# Patient Record
Sex: Female | Born: 1980 | Race: White | Hispanic: No | Marital: Single | Smoking: Never smoker
Health system: Southern US, Community
[De-identification: ages and names within clinical notes are randomized; demographics above are authoritative.]

## PROBLEM LIST (undated history)

## (undated) DIAGNOSIS — J45909 Unspecified asthma, uncomplicated: Secondary | ICD-10-CM

## (undated) DIAGNOSIS — L709 Acne, unspecified: Secondary | ICD-10-CM

## (undated) DIAGNOSIS — E739 Lactose intolerance, unspecified: Secondary | ICD-10-CM

## (undated) DIAGNOSIS — E785 Hyperlipidemia, unspecified: Secondary | ICD-10-CM

## (undated) DIAGNOSIS — B001 Herpesviral vesicular dermatitis: Secondary | ICD-10-CM

## (undated) DIAGNOSIS — T7840XA Allergy, unspecified, initial encounter: Secondary | ICD-10-CM

## (undated) DIAGNOSIS — F419 Anxiety disorder, unspecified: Secondary | ICD-10-CM

## (undated) DIAGNOSIS — A6000 Herpesviral infection of urogenital system, unspecified: Secondary | ICD-10-CM

## (undated) HISTORY — DX: Herpesviral vesicular dermatitis: B00.1

## (undated) HISTORY — PX: OTHER SURGICAL HISTORY: SHX169

## (undated) HISTORY — DX: Hyperlipidemia, unspecified: E78.5

## (undated) HISTORY — DX: Allergy, unspecified, initial encounter: T78.40XA

## (undated) HISTORY — DX: Lactose intolerance, unspecified: E73.9

## (undated) HISTORY — DX: Anxiety disorder, unspecified: F41.9

## (undated) HISTORY — DX: Herpesviral infection of urogenital system, unspecified: A60.00

## (undated) HISTORY — DX: Unspecified asthma, uncomplicated: J45.909

## (undated) HISTORY — DX: Acne, unspecified: L70.9

---

## 2007-08-30 HISTORY — PX: OTHER SURGICAL HISTORY: SHX169

## 2007-11-21 ENCOUNTER — Ambulatory Visit: Payer: Self-pay | Admitting: Obstetrics & Gynecology

## 2007-11-21 ENCOUNTER — Encounter: Payer: Self-pay | Admitting: Physician Assistant

## 2008-10-24 ENCOUNTER — Other Ambulatory Visit: Admission: RE | Admit: 2008-10-24 | Discharge: 2008-10-24 | Payer: Self-pay | Admitting: Obstetrics and Gynecology

## 2009-08-03 ENCOUNTER — Ambulatory Visit (HOSPITAL_BASED_OUTPATIENT_CLINIC_OR_DEPARTMENT_OTHER): Admission: RE | Admit: 2009-08-03 | Discharge: 2009-08-03 | Payer: Self-pay | Admitting: Otolaryngology

## 2011-01-11 NOTE — Group Therapy Note (Signed)
Amy Hull, Amy Hull              ACCOUNT NO.:  [id number]   MEDICAL RECORD NO.:  [id number]          PATIENT TYPE:  WOC   LOCATION:  [facility]                   FACILITY:  [facility]   PHYSICIAN:  Maylon Cos, CNM    DATE OF BIRTH:  04/14/1981   DATE OF SERVICE:  11/21/2007                                  CLINIC NOTE   The patient is being seen today, November 21, 2007, as a self-referral for  an annual exam and Pap smear.  Amy Hull is a 30 year old, Caucasian female,  who presents with no complaint other than the request for an annual  exam.   CURRENT ALLERGY LIST:  1. RAGWEED.  2. CATS.  She has no medication allergies, and no food allergies.   CURRENT MEDICATIONS:  1. Nasonex.  2. Albuterol inhaler p.r.n.  3. Multivitamin one p.o. daily.   IMMUNIZATIONS:  Up to date currently for Rubella, chicken pox, Tetanus,  and flu.   MENSTRUAL HISTORY:  Menarche at age 34 and had regular cycles until 2007  with the placement of a Mirena IUD.  Since then, she has had no periods,  no spotting, and no pain.  She is a nulliparous patient.   GYNECOLOGICAL HISTORY:  Benign.  Her last Pap smear was in February of  2007.  She has no history of abnormal Pap smears.   She has no surgical history.   FAMILY HISTORY:  Positive for high blood pressure in her father.   PAST MEDICAL HISTORY:  1. 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 maybe once or twice a month.  2. She also has been told that she has high cholesterol, but the      highest she was told was a total cholesterol of 220, which she      treats with diet and exercise, and it has not been checked in over      a year.  3. 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   SOCIAL HISTORY:  She lives alone.  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  She does not smoke.  She drinks alcohol  socially.  She does not drink caffeine.  She exercises two to three  times weekly.  She does not eat any red meat and she rarely eats fried  foods.   REVIEW OF SYSTEMS:  Negative on all accounts.   PHYSICAL EXAMINATION:  VITAL SIGNS:  Stable.  Temperature is 98.4, her  pulse is 69, her blood pressure is 96/63, and her weight is 170.8.  GENERAL:  Amy Hull is a pleasant Caucasian female, who is in no apparent  distress.  She appears to be her stated age and is appropriate in her  answers.  HEENT:  She is normocephalic, good dentition.  NECK:  No thyromegaly.  LUNGS:  Clear bilaterally to A&P.  HEART:  Regular rate and rhythm without murmur.  ABDOMEN:  Nontender, there are no masses, no hepatosplenomegaly, she  does have a navel piercing that is intact with signs of infection.  Her  femoral pulses are equal bilaterally at +1.  PELVIC EXAMINATION:  Her external genitalia are without lesions.  She is  a Tanner 5.  Her mucous membranes are pink without lesions.  She has a  scant amount of white, creamy discharge that is nonodorous.  Her cervix  is easily found using a speculum.  She has a regular rugae and good  vaginal tone.  Her cervix is thick without lesions.  Her IUD strings are  visible approximately 1.5 cm below the external os and intact.  The  cervix is nonfriable with a collection of a Pap smear specimen.  On  bimanual exam of her cervix, she has no cervical motion tenderness.  Her  uterus is small, not enlarged, and nontender.  Her adnexa are not  enlarged and nontender as well.  EXTREMITIES:  Warm to touch without any traces of edema.  Pedal pulses  are equal and bounding.   ASSESSMENT AND PLAN:  Amy Hull is 30 year old, nulliparous patient, who  presents today for a well-woman examination.  Pap smear was obtained,  and a sexually transmitted infection (STI) screening was offered but  declined by  patient.  The patient is instructed to continue her self-  breast examinations monthly and continue her exercise regimen as well as  her multivitamin with folic acid.  Contraceptive-wise, she is currently  covered for that with a Mirena intrauterine device (IUD), which will  need to be removed in August of 2012.  Patient was instructed to follow  up in 12 months for a Pap smear and annual examination or p.r.n.  problems.  Office will contact patient via mail with normal Pap smear  results.           ______________________________  Maylon Cos, CNM     SS/MEDQ  D:  11/21/2007  T:  11/21/2007  Job:  [id number]

## 2012-02-27 DIAGNOSIS — A6000 Herpesviral infection of urogenital system, unspecified: Secondary | ICD-10-CM

## 2012-02-27 HISTORY — DX: Herpesviral infection of urogenital system, unspecified: A60.00

## 2012-03-26 ENCOUNTER — Ambulatory Visit (INDEPENDENT_AMBULATORY_CARE_PROVIDER_SITE_OTHER): Payer: BC Managed Care – PPO | Admitting: Family Medicine

## 2012-03-26 VITALS — BP 124/74 | HR 55 | Temp 98.6°F | Resp 17 | Ht 67.5 in | Wt 196.0 lb

## 2012-03-26 DIAGNOSIS — L678 Other hair color and hair shaft abnormalities: Secondary | ICD-10-CM

## 2012-03-26 DIAGNOSIS — L738 Other specified follicular disorders: Secondary | ICD-10-CM

## 2012-03-26 DIAGNOSIS — L739 Follicular disorder, unspecified: Secondary | ICD-10-CM

## 2012-03-26 MED ORDER — SULFAMETHOXAZOLE-TRIMETHOPRIM 800-160 MG PO TABS: 1.0000 | ORAL_TABLET | Freq: Two times a day (BID) | ORAL | Status: AC

## 2012-03-26 NOTE — Progress Notes (Signed)
  [facility]:  Office Visit  Chief Complaint:  Chief Complaint  Patient presents with  . Rash    on gential from shaving per patient     HPI: Amy Hull is a 31 y.o. female who complains of  2 week h/o rash along right bikini lne. Went camping exposed to wet. Shaves in area  Occasionally but has not done it in a while. Denies STDs  Past Medical History  Diagnosis Date  . Asthma    History reviewed. No pertinent past surgical history. History   Social History  . Marital Status: Single    Spouse Name: N/A    Number of Children: N/A  . Years of Education: N/A   Social History Main Topics  . Smoking status: Never Smoker   . Smokeless tobacco: None  . Alcohol Use: No  . Drug Use: No  . Sexually Active: None   Other Topics Concern  . None   Social History Narrative  . None   Family History  Problem Relation Age of Onset  . Stroke Father    No Known Allergies Prior to Admission medications   Medication Sig Start Date End Date Taking? Authorizing Provider  albuterol (PROVENTIL) (5 MG/ML) 0.5% nebulizer solution Take 2.5 mg by nebulization every 6 (six) hours as needed.   Yes Historical Provider, MD  ISOtretinoin (ACCUTANE) 40 MG capsule Take 40 mg by mouth 2 (two) times daily.   Yes Historical Provider, MD     ROS: The patient denies fevers, chills, night sweats, unintentional weight loss, chest pain, palpitations, wheezing, dyspnea on exertion, nausea, vomiting, abdominal pain, dysuria, hematuria, melena, numbness, weakness, or tingling.   All other systems have been reviewed and were otherwise negative with the exception of those mentioned in the HPI and as above.    PHYSICAL EXAM: Filed Vitals:   03/26/12 1654  BP: 124/74  Pulse: 55  Temp: 98.6 F (37 C)  Resp: 17   Filed Vitals:   03/26/12 1654  Height: 5' 7.5" (1.715 m)  Weight: 196 lb (88.905 kg)   Body mass index is 30.24 kg/(m^2).  General: Alert, no acute distress HEENT:   Normocephalic, atraumatic, oropharynx patent.  Cardiovascular:  Regular rate and rhythm, no rubs murmurs or gallops.  No Carotid bruits, radial pulse intact. No pedal edema.  Respiratory: Clear to auscultation bilaterally.  No wheezes, rales, or rhonchi.  No cyanosis, no use of accessory musculature GI: No organomegaly, abdomen is soft and non-tender, positive bowel sounds.  No masses. Skin: + right bikini line excoriated lesions 1 cm  X 3 . + inguinal LAD on right side Neurologic: Facial musculature symmetric. Psychiatric: Patient is appropriate throughout our interaction. Lymphatic: No cervical lymphadenopathy Musculoskeletal: Gait intact.   LABS: No results found for this or any previous visit.   EKG/XRAY:   Primary read interpreted by Dr. Conley Rolls at [facility].   ASSESSMENT/PLAN: Encounter Diagnosis  Name Primary?  . Folliculitis Yes   Rx Bactrim BID x 10 Days HSV culture pending    Maygan Koeller PHUONG, DO 03/26/2012 5:23 PM

## 2012-03-28 LAB — HERPES SIMPLEX VIRUS CULTURE: Organism ID, Bacteria: DETECTED

## 2012-03-29 ENCOUNTER — Telehealth: Payer: Self-pay | Admitting: Family Medicine

## 2012-03-29 NOTE — Telephone Encounter (Signed)
Discussed labs with patient

## 2012-04-07 ENCOUNTER — Other Ambulatory Visit: Payer: Self-pay | Admitting: Family Medicine

## 2012-04-24 ENCOUNTER — Ambulatory Visit (INDEPENDENT_AMBULATORY_CARE_PROVIDER_SITE_OTHER): Payer: BC Managed Care – PPO | Admitting: Family Medicine

## 2012-04-24 VITALS — BP 108/74 | HR 62 | Temp 98.6°F | Resp 16 | Ht 68.0 in | Wt 196.8 lb

## 2012-04-24 DIAGNOSIS — L739 Follicular disorder, unspecified: Secondary | ICD-10-CM

## 2012-04-24 DIAGNOSIS — A6000 Herpesviral infection of urogenital system, unspecified: Secondary | ICD-10-CM

## 2012-04-24 DIAGNOSIS — L738 Other specified follicular disorders: Secondary | ICD-10-CM

## 2012-04-24 MED ORDER — SULFAMETHOXAZOLE-TRIMETHOPRIM 800-160 MG PO TABS: 1.0000 | ORAL_TABLET | Freq: Two times a day (BID) | ORAL | Status: AC

## 2012-04-24 MED ORDER — MUPIROCIN 2 % EX OINT: TOPICAL_OINTMENT | Freq: Three times a day (TID) | CUTANEOUS | Status: AC

## 2012-04-24 MED ORDER — VALACYCLOVIR HCL 500 MG PO TABS: ORAL_TABLET | ORAL | Status: DC

## 2012-04-24 NOTE — Progress Notes (Signed)
Subjective:    Patient ID: Amy Hull, female    DOB: Oct 16, 1980, 31 y.o.   MRN: [id number]  HPI This 31 y.o. female presents for evaluation of folliculitis/rash.  Evaluated two weeks ago; HSV culture obtained +HSV I; discussed with Dr. Conley Rolls; no rx provided.  Treated with Bactrim bid; 90% improved.  Going to [facility] with a lot of walking with a lot of friction.  Still having area of induration.  No pain; no itching/burning/tingling.  No redness to area; no drainage.  No fever/chills/sweats.  Last STD testing 09/2011; gets yearly with GYN and pap smear.  Intolerant to Doxycycline in past due to stomach upset.   PMH: none Psurg: none All: none. Medications: Accutane.   Review of Systems  Constitutional: Negative for fever and chills.  Skin: Positive for rash and wound. Negative for color change and pallor.  Hematological: Negative for adenopathy.    Past Medical History  Diagnosis Date  . Asthma   . Herpes labialis   . 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 Acne     No past surgical history on file.  Prior to Admission medications   Medication Sig Start Date End Date Taking? Authorizing Provider  albuterol (PROVENTIL) (5 MG/ML) 0.5% nebulizer solution Take 2.5 mg by nebulization every 6 (six) hours as needed.   Yes Historical Provider, MD  ISOtretinoin (ACCUTANE) 40 MG capsule Take 40 mg by mouth 2 (two) times daily.   Yes Historical Provider, MD  mupirocin ointment (BACTROBAN) 2 % Apply topically 3 (three) times daily. 04/24/12 05/01/12  Ethelda Chick, MD  sulfamethoxazole-trimethoprim (BACTRIM DS,SEPTRA DS) 800-160 MG per tablet Take 1 tablet by mouth 2 (two) times daily. 04/24/12 05/04/12  Ethelda Chick, MD  valACYclovir (VALTREX) 500 MG tablet One daily for suppression; increase to one twice daily x 7 days with outbreaks 04/24/12   Ethelda Chick, MD    No Known Allergies  History   Social History  . Marital Status: Single    Spouse Name: N/A    Number of  Children: N/A  . Years of Education: N/A   Occupational History  . Not on file.   Social History Main Topics  . Smoking status: Never Smoker   . Smokeless tobacco: Not on file  . Alcohol Use: No  . Drug Use: No  . Sexually Active: Not on file   Other Topics Concern  . Not on file   Social History Narrative   Single.    Family History  Problem Relation Age of Onset  . Stroke Father        Objective:   Physical Exam  Nursing note and vitals reviewed. Constitutional: She is oriented to person, place, and time. She appears well-developed and well-nourished. No distress.  Genitourinary:       R INTROITUS/GROIN:  HEALING DISCRETE LESIONS X 3 WITH PERSISTENT INDURATION WITHOUT ERYTHEMA, WARMTH, TENDERNESS; NO DRAINAGE.  NO PUSTULES OR VESICLES.  NO INGUINAL LAD.  Neurological: She is alert and oriented to person, place, and time. She has normal reflexes.  Skin: She is not diaphoretic.  Psychiatric: She has a normal mood and affect. Her behavior is normal. Judgment and thought content normal.       Assessment & Plan:   1. Folliculitis  sulfamethoxazole-trimethoprim (BACTRIM DS,SEPTRA DS) 800-160 MG per tablet, mupirocin ointment (BACTROBAN) 2 %  2. Herpes genitalia  valACYclovir (VALTREX) 500 MG tablet     1.  Folliculitis R groin/introitus region:  Improved but  persistent induration.  Intolerant to Doxycycline in past due to abdominal pain.  Refill of Bactrim DS bid x 7-10 days provided; rx for Bactroban also provided.   2.  Herpes I Genital:  New. Discussed diagnosis in detail during visit; discussed daily suppressive therapy versus PRN therapy; single thus recommend daily suppressive therapy.  Last STD screening 09/2011 and negative per GYN; declines repeat testing today.  Rx for Valtrex provided for suppressive therapy if chooses to start.

## 2012-04-24 NOTE — Patient Instructions (Addendum)
1. Folliculitis  sulfamethoxazole-trimethoprim (BACTRIM DS,SEPTRA DS) 800-160 MG per tablet, mupirocin ointment (BACTROBAN) 2 %  2. Herpes genitalia  valACYclovir (VALTREX) 500 MG tablet

## 2012-04-25 NOTE — Progress Notes (Signed)
Reviewed and agree.

## 2012-08-13 ENCOUNTER — Ambulatory Visit (INDEPENDENT_AMBULATORY_CARE_PROVIDER_SITE_OTHER): Payer: BC Managed Care – PPO | Admitting: Family Medicine

## 2012-08-13 VITALS — BP 106/68 | HR 58 | Temp 97.9°F | Resp 16 | Ht 68.0 in | Wt 200.0 lb

## 2012-08-13 DIAGNOSIS — L678 Other hair color and hair shaft abnormalities: Secondary | ICD-10-CM

## 2012-08-13 DIAGNOSIS — L02214 Cutaneous abscess of groin: Secondary | ICD-10-CM

## 2012-08-13 DIAGNOSIS — L03319 Cellulitis of trunk, unspecified: Secondary | ICD-10-CM

## 2012-08-13 DIAGNOSIS — L738 Other specified follicular disorders: Secondary | ICD-10-CM

## 2012-08-13 DIAGNOSIS — L02219 Cutaneous abscess of trunk, unspecified: Secondary | ICD-10-CM

## 2012-08-13 DIAGNOSIS — L739 Follicular disorder, unspecified: Secondary | ICD-10-CM

## 2012-08-13 MED ORDER — MUPIROCIN CALCIUM 2 % EX CREA: TOPICAL_CREAM | Freq: Three times a day (TID) | CUTANEOUS | Status: DC

## 2012-08-13 MED ORDER — DOXYCYCLINE HYCLATE 100 MG PO TABS: 100.0000 mg | ORAL_TABLET | Freq: Two times a day (BID) | ORAL | Status: DC

## 2012-08-13 NOTE — Progress Notes (Signed)
[facility]:  Office Visit  Chief Complaint:  Chief Complaint  Patient presents with  . Cellulitis    ingrown hair, groin x 3 days    HPI: Amy Hull is a 31 y.o. female who complains of  3 day h/o ingrown hair irritation around her groin, x 2 episodes. Tried to drain with hand but did not help. Has had this before after shaving. Shaved last week. H/o folliculitis.  Past Medical History  Diagnosis Date  . Asthma   . Herpes labialis   . 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 Acne   . Allergy    No past surgical history on file. History   Social History  . Marital Status: Single    Spouse Name: N/A    Number of Children: N/A  . Years of Education: N/A   Social History Main Topics  . Smoking status: Never Smoker   . Smokeless tobacco: Not on file  . Alcohol Use: Yes     Comment: 1-2 drinks a week  . Drug Use: No  . Sexually Active: Yes     Comment: 1 sexual partner in last 12 months   Other Topics Concern  . Not on file   Social History Narrative   Single.   Family History  Problem Relation Age of Onset  . Stroke Father    No Known Allergies Prior to Admission medications   Medication Sig Start Date End Date Taking? Authorizing Provider  valACYclovir (VALTREX) 500 MG tablet One daily for suppression; increase to one twice daily x 7 days with outbreaks 04/24/12  Yes Ethelda Chick, MD  albuterol (PROVENTIL) (5 MG/ML) 0.5% nebulizer solution Take 2.5 mg by nebulization every 6 (six) hours as needed.    Historical Provider, MD  ISOtretinoin (ACCUTANE) 40 MG capsule Take 40 mg by mouth 2 (two) times daily.    Historical Provider, MD     ROS: The patient denies fevers, chills, night sweats, unintentional weight loss, chest pain, palpitations, wheezing, dyspnea on exertion, nausea, vomiting, abdominal pain, dysuria, hematuria, melena, numbness, weakness, or tingling.   All other systems have been reviewed and were otherwise negative  with the exception of those mentioned in the HPI and as above.    PHYSICAL EXAM: Filed Vitals:   08/13/12 1515  BP: 106/68  Pulse: 58  Temp: 97.9 F (36.6 C)  Resp: 16   Filed Vitals:   08/13/12 1515  Height: 5\' 8"  (1.727 m)  Weight: 200 lb (90.719 kg)   Body mass index is 30.41 kg/(m^2).  General: Alert, no acute distress HEENT:  Normocephalic, atraumatic, oropharynx patent.  Cardiovascular:  Regular rate and rhythm, no rubs murmurs or gallops.  No Carotid bruits, radial pulse intact. No pedal edema.  Respiratory: Clear to auscultation bilaterally.  No wheezes, rales, or rhonchi.  No cyanosis, no use of accessory musculature GI: No organomegaly, abdomen is soft and non-tender, positive bowel sounds.  No masses. Skin: + left abscess x 2 on inner thigh, fluctuant. The lower left abscess feels as if it is bifurcated Neurologic: Facial musculature symmetric. Psychiatric: Patient is appropriate throughout our interaction. Lymphatic: No cervical lymphadenopathy Musculoskeletal: Gait intact.   LABS: Results for orders placed in visit on 03/26/12  HERPES SIMPLEX VIRUS CULTURE      Component Value Range   Organism ID, Bacteria HERPES SIMPLEX VIRUS TYPE 1 DETECTED.       EKG/XRAY:   Primary read interpreted by Dr. Conley Rolls at [facility]  ASSESSMENT/PLAN: Encounter Diagnoses  Name Primary?  Marland Kitchen Abscess of groin Yes  . Folliculitis    1. Rx Doxycycline 100 mg BID x 10 days 2. Bactroban cream prn when she gets small recurrent ones that do not need oral abx, today she will start on oral abx since she has more than 2 areas affected Warm compresses and sitz baths TID Patient declined I&D F/u prn    Jacey Pelc PHUONG, DO 08/13/2012 3:47 PM

## 2012-10-27 HISTORY — PX: CYST REMOVAL TRUNK: SHX6283

## 2012-11-28 ENCOUNTER — Encounter: Payer: Self-pay | Admitting: *Deleted

## 2012-12-11 ENCOUNTER — Encounter: Payer: Self-pay | Admitting: Nurse Practitioner

## 2012-12-11 ENCOUNTER — Ambulatory Visit (INDEPENDENT_AMBULATORY_CARE_PROVIDER_SITE_OTHER): Payer: BC Managed Care – PPO | Admitting: Nurse Practitioner

## 2012-12-11 VITALS — BP 118/60 | HR 70 | Resp 16 | Ht 67.75 in | Wt 196.0 lb

## 2012-12-11 DIAGNOSIS — Z113 Encounter for screening for infections with a predominantly sexual mode of transmission: Secondary | ICD-10-CM

## 2012-12-11 DIAGNOSIS — Z01419 Encounter for gynecological examination (general) (routine) without abnormal findings: Secondary | ICD-10-CM

## 2012-12-11 DIAGNOSIS — Z0000 Encounter for general adult medical examination without abnormal findings: Secondary | ICD-10-CM

## 2012-12-11 LAB — POCT URINALYSIS DIPSTICK
Ketones, UA: NEGATIVE
Protein, UA: NEGATIVE

## 2012-12-11 NOTE — Patient Instructions (Signed)

## 2012-12-11 NOTE — Progress Notes (Signed)
32 y.o. G0P0 Single Caucasian Fe here for annual exam.  Mirena IUD 10/12/10. Occasional spotting every other month. Last 2-3 days, no PMS symptoms.  Not dating currently.  Ended last relationship early Feb. 2014. No new health concerns.  Patient's last menstrual period was 10/25/2012.          Sexually active: no  The current method of family planning is IUD.    Exercising: yes  Gym/ health club routine includes cardio and swimming. Smoker:  no  Health Maintenance: Pap:  12/07/11 MMG: n/a TDaP: unsure if it had Pertussis coverage but had Td in 2006 Labs: Hgb 13.3, urine normal   reports that she has never smoked. She has never used smokeless tobacco. She reports that she drinks about 0.6 ounces of alcohol per week. She reports that she does not use illicit drugs.  Past Medical History  Diagnosis Date  . Asthma   . Herpes labialis   . 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 Acne   . Allergy   . Lactose intolerance     Past Surgical History  Procedure Laterality Date  . Mirena   10/07 and 10/12/10  . Dental implants  2009  . Cyst removal trunk  10/2012    mid breast area    Current Outpatient Prescriptions  Medication Sig Dispense Refill  . albuterol (PROVENTIL) (5 MG/ML) 0.5% nebulizer solution Take 2.5 mg by nebulization every 6 (six) hours as needed.      Marland Kitchen levonorgestrel (MIRENA) 20 MCG/24HR IUD 1 each by Intrauterine route once.      . Multiple Vitamin (MULTIVITAMIN) capsule Take 1 capsule by mouth daily.       No current facility-administered medications for this visit.    Family History  Problem Relation Age of Onset  . Stroke Father   . Hypertension Father   . Hyperlipidemia Father   . Thyroid disease Sister   . Ovarian cysts Sister     ROS:  Pertinent items are noted in HPI.  Otherwise, a comprehensive ROS was negative.  Exam:   BP 118/60  Pulse 70  Resp 16  Ht 5' 7.75" (1.721 m)  Wt 196 lb (88.905 kg)  BMI 30.02 kg/m2  LMP 10/25/2012    Height: 5' 7.75" (172.1 cm)  Ht Readings from Last 3 Encounters:  12/11/12 5' 7.75" (1.721 m)  08/13/12 5\' 8"  (1.727 m)  04/24/12 5\' 8"  (1.727 m)    General appearance: alert, cooperative and appears stated age Head: Normocephalic, without obvious abnormality, atraumatic Neck: no adenopathy, supple, symmetrical, trachea midline and thyroid normal to inspection and palpation Lungs: clear to auscultation bilaterally Breasts: normal appearance, no masses or tenderness Heart: regular rate and rhythm Abdomen: soft, non-tender; no masses,  no organomegaly Extremities: extremities normal, atraumatic, no cyanosis or edema Skin: Skin color, texture, turgor normal. No rashes or lesions Lymph nodes: Cervical, supraclavicular, and axillary nodes normal. No abnormal inguinal nodes palpated Neurologic: Grossly normal   Pelvic: External genitalia:  no lesions              Urethra:  normal appearing urethra with no masses, tenderness or lesions              Bartholin's and Skene's: normal                 Vagina: normal appearing vagina with normal color and discharge, no lesions              Cervix: anteverted IUD strings  visualized              Pap taken: yes along with HR HPV, GC/ Chl Bimanual Exam:  Uterus:  normal size, contour, position, consistency, mobility, non-tender              Adnexa: no mass, fullness, tenderness               Rectovaginal: Confirms               Anus:  normal sphincter tone, no lesions  A:  Well Woman with normal exam  Mirena IUD for contraception 10/12/10  R/O STD's  P:   pap smear as per guidelines  counseled on STD prevention, adequate intake of calcium and vitamin D, diet and exercise  Return annually or prn  An After Visit Summary was printed and given to the patient.

## 2012-12-12 LAB — STD PANEL
HIV: NONREACTIVE
Hepatitis B Surface Ag: NEGATIVE

## 2012-12-13 LAB — HEMOGLOBIN, FINGERSTICK: Hemoglobin, fingerstick: 13.3 g/dL (ref 12.0–16.0)

## 2012-12-13 NOTE — Progress Notes (Signed)
Encounter reviewed by Dr. Brook Silva.  

## 2012-12-17 ENCOUNTER — Telehealth: Payer: Self-pay | Admitting: Orthopedic Surgery

## 2012-12-17 NOTE — Telephone Encounter (Signed)
LMTCB about lab results.  aa 

## 2012-12-27 ENCOUNTER — Ambulatory Visit (INDEPENDENT_AMBULATORY_CARE_PROVIDER_SITE_OTHER): Payer: BC Managed Care – PPO | Admitting: Gynecology

## 2012-12-27 VITALS — BP 118/64

## 2012-12-27 DIAGNOSIS — R87811 Vaginal high risk human papillomavirus (HPV) DNA test positive: Secondary | ICD-10-CM

## 2012-12-27 DIAGNOSIS — IMO0002 Reserved for concepts with insufficient information to code with codable children: Secondary | ICD-10-CM

## 2012-12-27 NOTE — Procedures (Signed)
32 y.o. SingleCaucasianfemale   here for colposcopy. Pap done on December 11, 2012 showed ASCUS with POSITIVE high risk HPV  Patient's last menstrual period was 10/25/2012.  Contraception:  Mirena IUD   Last menstrual period 10/25/2012.  Procedure explained and patient's questions were invited and answered.  Consent form signed.    Role of HPV in genesis of SIL discussed with patient, and questions answered.   Speculum inserted atraumatically and cervix visualized.  3% acetic acid applied.  Cervix examined using 3.75, 7.5,15  X magnification and green filter.    Gross appearance:normal  Squamocolumnar junction seen in entirety: yes  Extent of lesion entirely seen: yes   no mosaicism, no punctation, no abnormal vasculature and acetowhite lesion(s) noted at 12 &6  o'clockcervix swabbed with Lugol's solution, SCJ visualized 360 degrees without lesions, endocervical curettage performed, endometrial biopsy performed, cervical biopsies taken at 12 o'clock, specimen labelled and sent to pathology and hemostasis achieved with Monsel's solution  Patient tolerated procedure well.   Plan:  Will base further treatment on Pathology findings.  Post biopsy instructions and AVS given to patient.  See picture in progress note

## 2012-12-27 NOTE — Progress Notes (Signed)
Subjective:     Patient ID: Amy Hull, female   DOB: 03-19-1981, 32 y.o.   MRN: [id number]  HPI   Review of Systems     Objective:   Physical Exam  Genitourinary:         Assessment:    ascus    Plan:    bx taken at 11 and ECC

## 2012-12-27 NOTE — Patient Instructions (Signed)

## 2013-01-02 LAB — IPS CERVICAL/ECC/EMB/VULVAR/VAGINAL BIOPSY

## 2013-03-05 ENCOUNTER — Ambulatory Visit (INDEPENDENT_AMBULATORY_CARE_PROVIDER_SITE_OTHER): Payer: BC Managed Care – PPO | Admitting: Family Medicine

## 2013-03-05 VITALS — BP 110/60 | HR 59 | Temp 98.2°F | Resp 18 | Ht 68.5 in | Wt 194.0 lb

## 2013-03-05 DIAGNOSIS — R109 Unspecified abdominal pain: Secondary | ICD-10-CM

## 2013-03-05 DIAGNOSIS — K5900 Constipation, unspecified: Secondary | ICD-10-CM

## 2013-03-05 DIAGNOSIS — K219 Gastro-esophageal reflux disease without esophagitis: Secondary | ICD-10-CM

## 2013-03-05 LAB — POCT CBC
Granulocyte percent: 70.5 % (ref 37–80)
HCT, POC: 41.4 % (ref 37.7–47.9)
Hemoglobin: 13.4 g/dL (ref 12.2–16.2)
Lymph, poc: 1.3 (ref 0.6–3.4)
MCH, POC: 29.8 pg (ref 27–31.2)
MCHC: 32.4 g/dL (ref 31.8–35.4)
MCV: 92.2 fL (ref 80–97)
MID (cbc): 0.3 (ref 0–0.9)
MPV: 8.5 fL (ref 0–99.8)
POC Granulocyte: 3.7 (ref 2–6.9)
POC LYMPH PERCENT: 23.8 % (ref 10–50)
POC MID %: 5.7 %M (ref 0–12)
Platelet Count, POC: 272 10*3/uL (ref 142–424)
RBC: 4.49 M/uL (ref 4.04–5.48)
RDW, POC: 13.3 %
WBC: 5.3 10*3/uL (ref 4.6–10.2)

## 2013-03-05 LAB — COMPREHENSIVE METABOLIC PANEL WITH GFR
ALT: 11 U/L (ref 0–35)
AST: 14 U/L (ref 0–37)
BUN: 15 mg/dL (ref 6–23)
CO2: 26 meq/L (ref 19–32)
Creat: 0.78 mg/dL (ref 0.50–1.10)
Total Bilirubin: 0.5 mg/dL (ref 0.3–1.2)

## 2013-03-05 LAB — COMPREHENSIVE METABOLIC PANEL
Albumin: 4.6 g/dL (ref 3.5–5.2)
Alkaline Phosphatase: 53 U/L (ref 39–117)
Calcium: 9.3 mg/dL (ref 8.4–10.5)
Chloride: 105 mEq/L (ref 96–112)
Glucose, Bld: 79 mg/dL (ref 70–99)
Potassium: 4.5 mEq/L (ref 3.5–5.3)
Sodium: 140 mEq/L (ref 135–145)
Total Protein: 7.1 g/dL (ref 6.0–8.3)

## 2013-03-05 LAB — LIPASE: Lipase: <10 U/L (ref 0–75)

## 2013-03-05 NOTE — Progress Notes (Signed)
[facility]:  Office Visit  Chief Complaint:  Chief Complaint  Patient presents with  . Cholelithiasis    stomach pain aft meals and burping a lot x1 week     HPI: Amy Hull is a 32 y.o. female who complains of  1 week history of midepigastric abd pain after eating stirfired turnips at [employer] house, then happened again after eating greasy bojangles chikcen with mash pot and gravey. + nausea. + burping and that makes it better. No fevers, chills. No diarrhea. No blood in urine or stool. She has constipation. Achey pain, does not radiate.  She is going to [place] for vacation.   Past Medical History  Diagnosis Date  . Asthma     exercise induced  . Herpes labialis   . 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 Acne     Accutane 2013  . Allergy     seasonal   . Lactose intolerance    Past Surgical History  Procedure Laterality Date  . Mirena   10/07 and 10/12/10  . Dental implants  2009  . Cyst removal trunk  10/2012    mid breast area   History   Social History  . Marital Status: Single    Spouse Name: N/A    Number of Children: N/A  . Years of Education: N/A   Social History Main Topics  . Smoking status: Never Smoker   . Smokeless tobacco: Never Used  . Alcohol Use: 0.6 oz/week    1 Glasses of wine per week     Comment: 1-2 drinks a week  . Drug Use: No  . Sexually Active: Yes -- Female partner(s)     Comment: 1 sexual partner in last 12 months   Other Topics Concern  . None   Social History Narrative   Single.   Family History  Problem Relation Age of Onset  . Stroke Father   . Hypertension Father   . Hyperlipidemia Father   . Thyroid disease Sister   . Ovarian cysts Sister   . Cancer Maternal Grandfather     lung  . Other Paternal Grandmother     hip replacemnt  . Alzheimer's disease Paternal Grandfather    No Known Allergies Prior to Admission medications   Medication Sig Start Date End Date Taking?  Authorizing Provider  albuterol (PROVENTIL) (5 MG/ML) 0.5% nebulizer solution Take 2.5 mg by nebulization every 6 (six) hours as needed.   Yes Historical Provider, MD  levonorgestrel (MIRENA) 20 MCG/24HR IUD 1 each by Intrauterine route once.   Yes Historical Provider, MD  Multiple Vitamin (MULTIVITAMIN) capsule Take 1 capsule by mouth daily.   Yes Historical Provider, MD     ROS: The patient denies fevers, chills, night sweats, unintentional weight loss, chest pain, palpitations, wheezing, dyspnea on exertion,  vomiting, dysuria, hematuria, melena, numbness, weakness, or tingling.   All other systems have been reviewed and were otherwise negative with the exception of those mentioned in the HPI and as above.    PHYSICAL EXAM: Filed Vitals:   03/05/13 0815  BP: 110/60  Pulse: 59  Temp: 98.2 F (36.8 C)  Resp: 18   Filed Vitals:   03/05/13 0815  Height: 5' 8.5" (1.74 m)  Weight: 194 lb (87.998 kg)   Body mass index is 29.07 kg/(m^2).  General: Alert, no acute distress HEENT:  Normocephalic, atraumatic, oropharynx patent. EOMI, PERRlA, TM nl  Cardiovascular:  Regular rate and rhythm, no rubs murmurs  or gallops.  No Carotid bruits, radial pulse intact. No pedal edema.  Respiratory: Clear to auscultation bilaterally.  No wheezes, rales, or rhonchi.  No cyanosis, no use of accessory musculature GI: No organomegaly, abdomen is soft and non-tender, positive bowel sounds.  No masses. Skin: No rashes. Neurologic: Facial musculature symmetric. Psychiatric: Patient is appropriate throughout our interaction. Lymphatic: No cervical lymphadenopathy Musculoskeletal: Gait intact.   LABS: Results for orders placed in visit on 03/05/13  POCT CBC      Result Value Range   WBC 5.3  4.6 - 10.2 K/uL   Lymph, poc 1.3  0.6 - 3.4   POC LYMPH PERCENT 23.8  10 - 50 %L   MID (cbc) 0.3  0 - 0.9   POC MID % 5.7  0 - 12 %M   POC Granulocyte 3.7  2 - 6.9   Granulocyte percent 70.5  37 - 80 %G   RBC  4.49  4.04 - 5.48 M/uL   Hemoglobin 13.4  12.2 - 16.2 g/dL   HCT, POC 40.9  81.1 - 47.9 %   MCV 92.2  80 - 97 fL   MCH, POC 29.8  27 - 31.2 pg   MCHC 32.4  31.8 - 35.4 g/dL   RDW, POC 91.4     Platelet Count, POC 272  142 - 424 K/uL   MPV 8.5  0 - 99.8 fL     EKG/XRAY:   Primary read interpreted by Dr. Conley Rolls at [facility].   ASSESSMENT/PLAN: Encounter Diagnoses  Name Primary?  . Abdominal pain Yes  . GERD (gastroesophageal reflux disease)   . Unspecified constipation    Will get CMP and lipase No leukocytosis Recommend Prilosec and Miralax F/u prn     Amy Reno PHUONG, DO 03/05/2013 9:13 AM

## 2013-03-05 NOTE — Patient Instructions (Signed)
Abdominal Pain (Nonspecific) Your exam might not show the exact reason you have abdominal pain. Since there are many different causes of abdominal pain, another checkup and more tests may be needed. It is very important to follow up for lasting (persistent) or worsening symptoms. A possible cause of abdominal pain in any person who still has his or her appendix is acute appendicitis. Appendicitis is often hard to diagnose. Normal blood tests, urine tests, ultrasound, and CT scans do not completely rule out early appendicitis or other causes of abdominal pain. Sometimes, only the changes that happen over time will allow appendicitis and other causes of abdominal pain to be determined. Other potential problems that may require surgery may also take time to become more apparent. Because of this, it is important that you follow all of the instructions below. HOME CARE INSTRUCTIONS   Rest as much as possible.  Do not eat solid food until your pain is gone.  While adults or children have pain: A diet of water, weak decaffeinated tea, broth or bouillon, gelatin, oral rehydration solutions (ORS), frozen ice pops, or ice chips may be helpful.  When pain is gone in adults or children: Start a light diet (dry toast, crackers, applesauce, or white rice). Increase the diet slowly as long as it does not bother you. Eat no dairy products (including cheese and eggs) and no spicy, fatty, fried, or high-fiber foods.  Use no alcohol, caffeine, or cigarettes.  Take your regular medicines unless your caregiver told you not to.  Take any prescribed medicine as directed.  Only take over-the-counter or prescription medicines for pain, discomfort, or fever as directed by your caregiver. Do not give aspirin to children. If your caregiver has given you a follow-up appointment, it is very important to keep that appointment. Not keeping the appointment could result in a permanent injury and/or lasting (chronic) pain and/or  disability. If there is any problem keeping the appointment, you must call to reschedule.  SEEK IMMEDIATE MEDICAL CARE IF:   Your pain is not gone in 24 hours.  Your pain becomes worse, changes location, or feels different.  You or your child has an oral temperature above 102 F (38.9 C), not controlled by medicine.  Your baby is older than 3 months with a rectal temperature of 102 F (38.9 C) or higher.  Your baby is 53 months old or younger with a rectal temperature of 100.4 F (38 C) or higher.  You have shaking chills.  You keep throwing up (vomiting) or cannot drink liquids.  There is blood in your vomit or you see blood in your bowel movements.  Your bowel movements become dark or black.  You have frequent bowel movements.  Your bowel movements stop (become blocked) or you cannot pass gas.  You have bloody, frequent, or painful urination.  You have yellow discoloration in the skin or whites of the eyes.  Your stomach becomes bloated or bigger.  You have dizziness or fainting.  You have chest or back pain. MAKE SURE YOU:   Understand these instructions.  Will watch your condition.  Will get help right away if you are not doing well or get worse. Document Released: 08/15/2005 Document Revised: 11/07/2011 Document Reviewed: 07/13/2009 [facility] Patient Information 2014 [place], [place]. Gastroesophageal Reflux Disease, Adult Gastroesophageal reflux disease (GERD) happens when acid from your stomach flows up into the esophagus. When acid comes in contact with the esophagus, the acid causes soreness (inflammation) in the esophagus. Over time, GERD may create  small holes (ulcers) in the lining of the esophagus. CAUSES   Increased body weight. This puts pressure on the stomach, making acid rise from the stomach into the esophagus.  Smoking. This increases acid production in the stomach.  Drinking alcohol. This causes decreased pressure in the lower esophageal  sphincter (valve or ring of muscle between the esophagus and stomach), allowing acid from the stomach into the esophagus.  Late evening meals and a full stomach. This increases pressure and acid production in the stomach.  A malformed lower esophageal sphincter. Sometimes, no cause is found. SYMPTOMS   Burning pain in the lower part of the mid-chest behind the breastbone and in the mid-stomach area. This may occur twice a week or more often.  Trouble swallowing.  Sore throat.  Dry cough.  Asthma-like symptoms including chest tightness, shortness of breath, or wheezing. DIAGNOSIS  Your caregiver may be able to diagnose GERD based on your symptoms. In some cases, X-rays and other tests may be done to check for complications or to check the condition of your stomach and esophagus. TREATMENT  Your caregiver may recommend over-the-counter or prescription medicines to help decrease acid production. Ask your caregiver before starting or adding any new medicines.  HOME CARE INSTRUCTIONS   Change the factors that you can control. Ask your caregiver for guidance concerning weight loss, quitting smoking, and alcohol consumption.  Avoid foods and drinks that make your symptoms worse, such as:  Caffeine or alcoholic drinks.  Chocolate.  Peppermint or mint flavorings.  Garlic and onions.  Spicy foods.  Citrus fruits, such as oranges, lemons, or limes.  Tomato-based foods such as sauce, chili, salsa, and pizza.  Fried and fatty foods.  Avoid lying down for the 3 hours prior to your bedtime or prior to taking a nap.  Eat small, frequent meals instead of large meals.  Wear loose-fitting clothing. Do not wear anything tight around your waist that causes pressure on your stomach.  Raise the head of your bed 6 to 8 inches with wood blocks to help you sleep. Extra pillows will not help.  Only take over-the-counter or prescription medicines for pain, discomfort, or fever as directed by  your caregiver.  Do not take aspirin, ibuprofen, or other nonsteroidal anti-inflammatory drugs (NSAIDs). SEEK IMMEDIATE MEDICAL CARE IF:   You have pain in your arms, neck, jaw, teeth, or back.  Your pain increases or changes in intensity or duration.  You develop nausea, vomiting, or sweating (diaphoresis).  You develop shortness of breath, or you faint.  Your vomit is green, yellow, black, or looks like coffee grounds or blood.  Your stool is red, bloody, or black. These symptoms could be signs of other problems, such as heart disease, gastric bleeding, or esophageal bleeding. MAKE SURE YOU:   Understand these instructions.  Will watch your condition.  Will get help right away if you are not doing well or get worse. Document Released: 05/25/2005 Document Revised: 11/07/2011 Document Reviewed: 03/04/2011 [facility] Patient Information 2014 [place], [place].

## 2013-04-01 ENCOUNTER — Ambulatory Visit (INDEPENDENT_AMBULATORY_CARE_PROVIDER_SITE_OTHER): Payer: BC Managed Care – PPO | Admitting: Emergency Medicine

## 2013-04-01 VITALS — BP 120/60 | HR 64 | Temp 98.0°F | Resp 16 | Ht 67.5 in | Wt 194.0 lb

## 2013-04-01 DIAGNOSIS — J209 Acute bronchitis, unspecified: Secondary | ICD-10-CM

## 2013-04-01 DIAGNOSIS — J018 Other acute sinusitis: Secondary | ICD-10-CM

## 2013-04-01 MED ORDER — AMOXICILLIN-POT CLAVULANATE 875-125 MG PO TABS: 1.0000 | ORAL_TABLET | Freq: Two times a day (BID) | ORAL | Status: DC

## 2013-04-01 MED ORDER — PSEUDOEPHEDRINE-GUAIFENESIN ER 60-600 MG PO TB12: 1.0000 | ORAL_TABLET | Freq: Two times a day (BID) | ORAL | Status: DC

## 2013-04-01 NOTE — Patient Instructions (Addendum)

## 2013-04-01 NOTE — Progress Notes (Signed)
[facility] [address] [place] [address] [phone]  Date:  04/01/2013   Name:  Amy Hull   DOB:  May 08, 1981   MRN:  [id number]  PCP:  No PCP Per Patient    Chief Complaint: Sinusitis, Sore Throat and Cough   History of Present Illness:  [name] is a 32 y.o. very pleasant female patient who presents with the following:  Ill for one week with nasal congestion and purulent drainage and post nasal drip.  Has a frontal headache and pain in teeth.   Sore throat past 4 days.  Has a cough that is productive of purulent sputum.  No fever or chills.  No nausea or vomiting.  No improvement with over the counter medications or other home remedies. Denies other complaint or health concern today.   There are no active problems to display for this patient.   Past Medical History  Diagnosis Date  . Asthma     exercise induced  . Herpes labialis   . 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 Acne     Accutane 2013  . Allergy     seasonal   . Lactose intolerance     Past Surgical History  Procedure Laterality Date  . Mirena   10/07 and 10/12/10  . Dental implants  2009  . Cyst removal trunk  10/2012    mid breast area    History  Substance Use Topics  . Smoking status: Never Smoker   . Smokeless tobacco: Never Used  . Alcohol Use: 0.6 oz/week    1 Glasses of wine per week     Comment: 1-2 drinks a week    Family History  Problem Relation Age of Onset  . Stroke Father   . Hypertension Father   . Hyperlipidemia Father   . Thyroid disease Sister   . Ovarian cysts Sister   . Cancer Maternal Grandfather     lung  . Other Paternal Grandmother     hip replacemnt  . Alzheimer's disease Paternal Grandfather     No Known Allergies  Medication list has been reviewed and updated.  Current Outpatient Prescriptions on File Prior to Visit  Medication Sig Dispense Refill  . albuterol (PROVENTIL) (5 MG/ML) 0.5% nebulizer solution Take  2.5 mg by nebulization every 6 (six) hours as needed.      Marland Kitchen levonorgestrel (MIRENA) 20 MCG/24HR IUD 1 each by Intrauterine route once.      . Multiple Vitamin (MULTIVITAMIN) capsule Take 1 capsule by mouth daily.       No current facility-administered medications on file prior to visit.    Review of Systems:  As per HPI, otherwise negative.    Physical Examination: Filed Vitals:   04/01/13 1444  BP: 120/60  Pulse: 64  Temp: 98 F (36.7 C)  Resp: 16   Filed Vitals:   04/01/13 1444  Height: 5' 7.5" (1.715 m)  Weight: 194 lb (87.998 kg)   Body mass index is 29.92 kg/(m^2). Ideal Body Weight: Weight in (lb) to have BMI = 25: 161.7  GEN: WDWN, NAD, Non-toxic, A & O x 3 HEENT: Atraumatic, Normocephalic. Neck supple. No masses, No LAD. Ears and Nose: No external deformity. CV: RRR, No M/G/R. No JVD. No thrill. No extra heart sounds. PULM: CTA B, no wheezes, crackles, rhonchi. No retractions. No resp. distress. No accessory muscle use. ABD: S, NT, ND, +BS. No rebound. No HSM. EXTR: No c/c/e NEURO Normal gait.  PSYCH: Normally interactive. Conversant. Not depressed or anxious appearing.  Calm demeanor.    Assessment and Plan: Sinusitis augmentin mucinex   Signed,  Phillips Odor, MD

## 2013-07-04 ENCOUNTER — Other Ambulatory Visit: Payer: Self-pay

## 2013-09-17 ENCOUNTER — Ambulatory Visit (INDEPENDENT_AMBULATORY_CARE_PROVIDER_SITE_OTHER): Payer: No Typology Code available for payment source | Admitting: Obstetrics & Gynecology

## 2013-09-17 ENCOUNTER — Encounter: Payer: Self-pay | Admitting: Obstetrics & Gynecology

## 2013-09-17 VITALS — BP 108/66 | HR 74 | Resp 18 | Ht 68.0 in | Wt 198.0 lb

## 2013-09-17 DIAGNOSIS — Z01419 Encounter for gynecological examination (general) (routine) without abnormal findings: Secondary | ICD-10-CM

## 2013-09-17 DIAGNOSIS — N764 Abscess of vulva: Secondary | ICD-10-CM

## 2013-09-17 DIAGNOSIS — R87620 Atypical squamous cells of undetermined significance on cytologic smear of vagina (ASC-US): Secondary | ICD-10-CM

## 2013-09-17 DIAGNOSIS — R87811 Vaginal high risk human papillomavirus (HPV) DNA test positive: Secondary | ICD-10-CM

## 2013-09-17 DIAGNOSIS — Z0000 Encounter for general adult medical examination without abnormal findings: Secondary | ICD-10-CM

## 2013-09-17 LAB — POCT URINALYSIS DIPSTICK
Blood, UA: 2
UROBILINOGEN UA: NEGATIVE
pH, UA: 5

## 2013-09-17 LAB — HEMOGLOBIN, FINGERSTICK: Hemoglobin, fingerstick: 12.9 g/dL (ref 12.0–16.0)

## 2013-09-17 MED ORDER — SULFAMETHOXAZOLE-TRIMETHOPRIM 800-160 MG PO TABS: 1.0000 | ORAL_TABLET | Freq: Two times a day (BID) | ORAL | Status: DC

## 2013-09-17 NOTE — Progress Notes (Signed)
33 y.o. G0P0 Single CaucasianF here for annual exam.  Minimal vaginal bleeding with Mirena.  Placed 2/12.  Needs replacing 2/17.  PCP is at [facility].  Pt may be moving over the next few months to [facility]  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    Has recurrent area on vulva that she would like me to evaluated.  Very tender area present today.  These recurrent tender areas tend to get worse with more exercise--which she has been doing lately.  Has been seen several times at [facility] [address].  With one of these, she had a skin culture that was positive for HSV 2.  She really doesn't believe she has HSV 2.  Has been on antibiotics in the past as well.  All noted from [place] reviewed.    H/O of abnormal Pap with +HR HPV last year.  Dr. Farrel Gobble did her colposcopy.  F/U one year recommended.   Patient's last menstrual period was 10/12/2010.          Sexually active: yes  The current method of family planning is IUD.    Exercising: yes  cardio, weights, yoga 4-5x/wk Smoker:  no  Health Maintenance: Pap:  12/11/12 ASCUS + HPV ; had Colpo 12/27/12  History of abnormal Pap:  yes MMG:  none Colonoscopy:  none BMD:   none TDaP:  2006  Screening Labs: Hb today: 12.9, Urine today: Trace Leuks, Blood 2   reports that she has never smoked. She has never used smokeless tobacco. She reports that she drinks about 0.6 ounces of alcohol per week. She reports that she does not use illicit drugs.  Past Medical History  Diagnosis Date  . Asthma     exercise induced  . Herpes labialis   . 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 Acne     Accutane 2013  . Allergy     seasonal   . Lactose intolerance     Past Surgical History  Procedure Laterality Date  . Mirena   10/07 and 10/12/10  . Dental implants  2009  . Cyst removal trunk  10/2012    mid breast area    Current Outpatient Prescriptions  Medication Sig Dispense Refill  . albuterol (PROVENTIL) (5 MG/ML) 0.5%  nebulizer solution Take 2.5 mg by nebulization every 6 (six) hours as needed.      Marland Kitchen amoxicillin-clavulanate (AUGMENTIN) 875-125 MG per tablet Take 1 tablet by mouth 2 (two) times daily.  20 tablet  0  . levonorgestrel (MIRENA) 20 MCG/24HR IUD 1 each by Intrauterine route once.      . Multiple Vitamin (MULTIVITAMIN) capsule Take 1 capsule by mouth daily.      . pseudoephedrine-guaifenesin (MUCINEX D) 60-600 MG per tablet Take 1 tablet by mouth every 12 (twelve) hours.  18 tablet  0   No current facility-administered medications for this visit.    Family History  Problem Relation Age of Onset  . Stroke Father   . Hypertension Father   . Hyperlipidemia Father   . Thyroid disease Sister   . Ovarian cysts Sister   . Cancer Maternal Grandfather     lung  . Other Paternal Grandmother     hip replacemnt  . Alzheimer's disease Paternal Grandfather     ROS:  Pertinent items are noted in HPI.  Otherwise, a comprehensive ROS was negative.  Exam:   BP 108/66  Pulse 74  Resp 18  Ht 5\' 8"  (1.727 m)  Wt 198 lb (89.812 kg)  BMI 30.11 kg/m2  LMP 10/12/2010  Weight change: +6lbs   Height: 5\' 8"  (172.7 cm)  Ht Readings from Last 3 Encounters:  09/17/13 5\' 8"  (1.727 m)  04/01/13 5' 7.5" (1.715 m)  03/05/13 5' 8.5" (1.74 m)    General appearance: alert, cooperative and appears stated age Head: Normocephalic, without obvious abnormality, atraumatic Neck: no adenopathy, supple, symmetrical, trachea midline and thyroid normal to inspection and palpation Lungs: clear to auscultation bilaterally Breasts: normal appearance, no masses or tenderness Heart: regular rate and rhythm Abdomen: soft, non-tender; bowel sounds normal; no masses,  no organomegaly Extremities: extremities normal, atraumatic, no cyanosis or edema Skin: Skin color, texture, turgor normal. No rashes or lesions Lymph nodes: Cervical, supraclavicular, and axillary nodes normal. No abnormal inguinal nodes palpated Neurologic:  Grossly normal   Pelvic: External genitalia:  Several areas of scar from prior small abscesses, abscess on right buttocks present--fluctuant.  Area in crease of left leg that is deep but also tender--dime sized              Urethra:  normal appearing urethra with no masses, tenderness or lesions              Bartholins and Skenes: normal                 Vagina: normal appearing vagina with normal color and discharge, no lesions              Cervix: no lesions              Pap taken: yes Bimanual Exam:  Uterus:  normal size, contour, position, consistency, mobility, non-tender              Adnexa: normal adnexa and no mass, fullness, tenderness               Rectovaginal: Confirms               Anus:  normal sphincter tone, no lesions  Procudure: Area on buttocks cleansed with Betadine and opened with #11 blade. Purulent material drained.  Culture obtained.  Dressing applied.  Pt tolerated this well.  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  P:   Mammogram starting age 33 pap smear with HR HPV Wound culture Bactrim DS x 10 days.  Rx to pharamcy. Mirena needs to be replaced 2/17. return annually or prn  An After Visit Summary was printed and given to the patient.

## 2013-09-17 NOTE — Patient Instructions (Signed)
Hidradenitis Suppurativa, Sweat Gland Abscess °Hidradenitis suppurativa is a long lasting (chronic), uncommon disease of the sweat glands. With this, boil-like lumps and scarring develop in the groin, some times under the arms (axillae), and under the breasts. It may also uncommonly occur behind the ears, in the crease of the buttocks, and around the genitals.  °CAUSES  °The cause is from a blocking of the sweat glands. They then become infected. It may cause drainage and odor. It is not contagious. So it cannot be given to someone else. It most often shows up in puberty (about 10 to 33 years of age). But it may happen much later. It is similar to acne which is a disease of the sweat glands. This condition is slightly more common in African-Americans and women. °SYMPTOMS  °· Hidradenitis usually starts as one or more red, tender, swellings in the groin or under the arms (axilla). °· Over a period of hours to days the lesions get larger. They often open to the skin surface, draining clear to yellow-colored fluid. °· The infected area heals with scarring. °DIAGNOSIS  °Your caregiver makes this diagnosis by looking at you. Sometimes cultures (growing germs on plates in the lab) may be taken. This is to see what germ (bacterium) is causing the infection.  °TREATMENT  °· Topical germ killing medicine applied to the skin (antibiotics) are the treatment of choice. Antibiotics taken by mouth (systemic) are sometimes needed when the condition is getting worse or is severe. °· Avoid tight-fitting clothing which traps moisture in. °· Dirt does not cause hidradenitis and it is not caused by poor hygiene. °· Involved areas should be cleaned daily using an antibacterial soap. Some patients find that the liquid form of Lever 2000®, applied to the involved areas as a lotion after bathing, can help reduce the odor related to this condition. °· Sometimes surgery is needed to drain infected areas or remove scarred tissue. Removal of  large amounts of tissue is used only in severe cases. °· Birth control pills may be helpful. °· Oral retinoids (vitamin A derivatives) for 6 to 12 months which are effective for acne may also help this condition. °· Weight loss will improve but not cure hidradenitis. It is made worse by being overweight. But the condition is not caused by being overweight. °· This condition is more common in people who have had acne. °· It may become worse under stress. °There is no medical cure for hidradenitis. It can be controlled, but not cured. The condition usually continues for years with periods of getting worse and getting better (remission). °Document Released: 03/29/2004 Document Revised: 11/07/2011 Document Reviewed: 04/14/2008 °[employer]® Patient Information ©2014 [employer]. ° °

## 2013-09-19 LAB — IPS PAP TEST WITH HPV

## 2013-09-20 LAB — WOUND CULTURE
GRAM STAIN: NONE SEEN
Gram Stain: NONE SEEN

## 2013-09-25 NOTE — Addendum Note (Signed)
Addended by: Jerene BearsMILLER, Stewart Pimenta S on: 09/25/2013 03:44 PM   Modules accepted: Orders

## 2013-10-01 ENCOUNTER — Telehealth: Payer: Self-pay | Admitting: Emergency Medicine

## 2013-10-01 ENCOUNTER — Ambulatory Visit: Payer: BC Managed Care – PPO | Admitting: Podiatry

## 2013-10-01 NOTE — Telephone Encounter (Signed)
Advised patient that due to a high deductible on her insurance plan, she will be responsible for $328.15 when she comes in for colpo. Patient may want to schedule at a later time. [facility]

## 2013-10-01 NOTE — Telephone Encounter (Signed)
Spoke with patient. [place] so [place] will be moving and obtaining per patient insurance with better coverage. She does not want to pay out of pocket for procedure so she is asking if Dr. Hyacinth MeekerMiller would be okay if she waits until after her move and can forward records to a new provider to have colposcopy done. Advised that it would be best not to wait but that would send a message to Dr. Hyacinth MeekerMiller to obtain her advice.

## 2013-10-01 NOTE — Telephone Encounter (Signed)
Message copied by Joeseph AmorFAST, Tedford Berg L on Tue Oct 01, 2013 10:26 AM ------      Message from: Jerene BearsMILLER, MARY S      Created: Wed Sep 25, 2013  3:43 PM       Can you inform pt Pap is abnormal--ASCUS with +HR HPV.  H/O abn pap last year.  Needs colpo.  She has a skin boil that I opened.  Culture showed Proteus which is unusual in the skin.  She needs to complete the antibiotics and I've call a dermatologist to see if they have any recommendations.  Will discuss with her when she comes for the colpo. ------

## 2013-10-01 NOTE — Telephone Encounter (Signed)
Spoke with patient. Message from Dr. Hyacinth MeekerMiller given. Advised pap is abnormal with need for colposcopy. Patient verbalized understanding and need for follow up.  Colposcopy pre-procedure instructions given. Motrin instructions given. Motrin=Advil=Ibuprofen Can take 800 mg (Can purchase over the counter, you will need four 200 mg pills) Take with food. Make sure to eat a meal before appointment and drink plenty of fluids. Patient verbalized understanding and will call to reschedule if will be on menses or has any concerns regarding pregnancy.   Patient has Mirena IUD and no menses.  Procedure room scheduled with appointment, linked order.   Patient will call back with any questions.  Routing to provider for final review. Patient agreeable to disposition. Will close encounter  Sabrina, I advised you would be calling her with precert amount. Can you call her?

## 2013-10-02 NOTE — Telephone Encounter (Signed)
Message left to return call to Gorden Stthomas at [phone].    

## 2013-10-02 NOTE — Telephone Encounter (Signed)
My preference would be for her to proceed now and not wait several months but that is her choice to make.  If she delays, can she please notify us when she does have it done so we can document then.  Also, when she gets established, she will need to see a dermatologist about her skin condition and we will need to send records.

## 2013-10-08 ENCOUNTER — Ambulatory Visit: Payer: No Typology Code available for payment source | Admitting: Obstetrics & Gynecology

## 2013-10-14 MED ORDER — SULFAMETHOXAZOLE-TRIMETHOPRIM 800-160 MG PO TABS: 1.0000 | ORAL_TABLET | Freq: Two times a day (BID) | ORAL | Status: DC

## 2013-10-14 NOTE — Telephone Encounter (Signed)
Patient returned call. She declines to schedule colposcopy at this time.  She is requesting a refill of Bactrim as she states her skin infection has returned.  Advised that Dr. Hyacinth MeekerMiller wanted her to follow up with a Dermatologist and patient is agreeable for both Derm follow up and Colpo but not until 3./23 when she has new insurance. Requests refill of Bactrim. I advised I would send Dr. Hyacinth MeekerMiller the request and return her call.

## 2013-10-14 NOTE — Addendum Note (Signed)
Addended by: Jerene BearsMILLER, Vi Biddinger S on: 10/14/2013 10:44 AM   Modules accepted: Orders

## 2013-10-14 NOTE — Telephone Encounter (Signed)
rx done.  Please ask her to notify us as soon as she has established care in [place].

## 2013-10-14 NOTE — Telephone Encounter (Signed)
Not sure if i routed note to you.

## 2013-10-14 NOTE — Telephone Encounter (Signed)
Spoke with patient and message from Dr. Hyacinth MeekerMiller given. She will follow up when she has established care so that records can be sent.

## 2013-10-17 ENCOUNTER — Ambulatory Visit (INDEPENDENT_AMBULATORY_CARE_PROVIDER_SITE_OTHER): Payer: No Typology Code available for payment source | Admitting: Podiatry

## 2013-10-17 ENCOUNTER — Encounter: Payer: Self-pay | Admitting: Podiatry

## 2013-10-17 VITALS — BP 102/61 | HR 74 | Resp 16 | Ht 67.0 in | Wt 190.0 lb

## 2013-10-17 DIAGNOSIS — L600 Ingrowing nail: Secondary | ICD-10-CM

## 2013-10-17 DIAGNOSIS — L608 Other nail disorders: Secondary | ICD-10-CM

## 2013-10-17 NOTE — Progress Notes (Signed)
   Subjective:    Patient ID: Amy Hull, female    DOB: 09-20-1980, 33 y.o.   MRN: [id number]  HPI Comments: i have fungus on my big toe on the left foot. Ive had it since January. i started feeling the pain a month ago. It wakes me up at night. i have tried topical over the counter struff and it didn't work. i tried vicks vapor rub and some gel stuff.     Review of Systems  Skin:       Change in nails  All other systems reviewed and are negative.       Objective:   Physical Exam: I have reviewed her past medical history medications allergies surgeries social history. At this point vital signs are stable she is alert and oriented x3. Pulses are strongly palpable bilateral. Orthopedic evaluation demonstrates all joints distal to the ankle have a full range of motion without crepitus. Deep tendon reflexes are intact bilateral. Neurologic sensorium is intact bilateral. Cutaneous evaluation demonstrates supple well hydrated cutis bilaterally. She has a dystrophic hallux nail left with mild paronychia and ingrown margins. This does appear to be a traumatic injury. I also cannot rule out fungus at this point.          Assessment & Plan:  Assessment: Ingrown nail abscess somewhat subungual irritation. Possible onychomycosis.  Plan: Total nail avulsion temporary in nature left foot. She tolerated procedure well I will followup with her in one week and we sent the nail for pathologic evaluation.

## 2013-10-17 NOTE — Patient Instructions (Signed)

## 2013-10-29 ENCOUNTER — Ambulatory Visit: Payer: No Typology Code available for payment source | Admitting: Podiatry

## 2013-11-12 ENCOUNTER — Ambulatory Visit (INDEPENDENT_AMBULATORY_CARE_PROVIDER_SITE_OTHER): Payer: No Typology Code available for payment source | Admitting: Podiatry

## 2013-11-12 ENCOUNTER — Encounter: Payer: Self-pay | Admitting: Podiatry

## 2013-11-12 VITALS — BP 102/61 | HR 74 | Resp 18

## 2013-11-12 DIAGNOSIS — L600 Ingrowing nail: Secondary | ICD-10-CM

## 2013-11-12 NOTE — Progress Notes (Signed)
She presents today for followup of her hallux nail left her pathology has not come back yet from the nail plate however she appears to have a new nail plate growing from the hallux left.  Objective: There is no erythema edema cellulitis drainage or odor. The nail plate plate appears to be growing in nicely there is mild tinea pedis surrounding it.  Assessment: Well-healing nail avulsion hallux left still awaiting pathology results.  Plan: Provided her with samples ofLUZU and I will followup with her once her pathology report comes in to

## 2013-12-16 ENCOUNTER — Ambulatory Visit: Payer: BC Managed Care – PPO | Admitting: Nurse Practitioner

## 2014-09-29 ENCOUNTER — Ambulatory Visit: Payer: No Typology Code available for payment source | Admitting: Obstetrics & Gynecology

## 2015-06-25 ENCOUNTER — Encounter: Payer: Self-pay | Admitting: Podiatry

## 2017-11-27 ENCOUNTER — Ambulatory Visit: Payer: BC Managed Care – PPO | Admitting: Physician Assistant

## 2017-11-27 ENCOUNTER — Ambulatory Visit (INDEPENDENT_AMBULATORY_CARE_PROVIDER_SITE_OTHER): Payer: BC Managed Care – PPO

## 2017-11-27 ENCOUNTER — Encounter: Payer: Self-pay | Admitting: Physician Assistant

## 2017-11-27 VITALS — BP 115/76 | HR 67 | Temp 98.2°F | Resp 16 | Ht 67.0 in | Wt 212.0 lb

## 2017-11-27 DIAGNOSIS — S91331A Puncture wound without foreign body, right foot, initial encounter: Secondary | ICD-10-CM

## 2017-11-27 DIAGNOSIS — Z23 Encounter for immunization: Secondary | ICD-10-CM

## 2017-11-27 DIAGNOSIS — S99921A Unspecified injury of right foot, initial encounter: Secondary | ICD-10-CM | POA: Diagnosis not present

## 2017-11-27 MED ORDER — ONDANSETRON 8 MG PO TBDP: 8.0000 mg | ORAL_TABLET | Freq: Three times a day (TID) | ORAL | 0 refills | Status: DC | PRN

## 2017-11-27 MED ORDER — DOXYCYCLINE HYCLATE 100 MG PO CAPS: 100.0000 mg | ORAL_CAPSULE | Freq: Two times a day (BID) | ORAL | 0 refills | Status: DC

## 2017-11-27 MED ORDER — AMOXICILLIN-POT CLAVULANATE 875-125 MG PO TABS: 1.0000 | ORAL_TABLET | Freq: Two times a day (BID) | ORAL | 0 refills | Status: AC

## 2017-11-27 MED ORDER — SULFAMETHOXAZOLE-TRIMETHOPRIM 800-160 MG PO TABS: 1.0000 | ORAL_TABLET | Freq: Two times a day (BID) | ORAL | 0 refills | Status: DC

## 2017-11-27 MED ORDER — TRAMADOL HCL 50 MG PO TABS: 50.0000 mg | ORAL_TABLET | Freq: Three times a day (TID) | ORAL | 0 refills | Status: DC | PRN

## 2017-11-27 NOTE — Progress Notes (Signed)
[facility] [address], [place] [id number] [phone]  Date:  11/27/2017   Name:  Amy Hull   DOB:  04/28/1981   MRN:  [id number]  PCP:  Patient, No Pcp Per    History of Present Illness:  Amy Hull is a 37 y.o. female patient who presents to PCP with  Chief Complaint  Patient presents with  . Foot Injury    pt stepped on nail/ x 1 day rt foot     Patient reports that she stepped on a nail while she was doing yard work.  She was wearing flip-flops.  The nail went through the shoe into her foot.  She washed the foot out with a hose.  And soaked her foot in a Hibiclens solution for about 10 minutes.  She then bandaged it up.  She has had no fever.  It is painful localized at her foot.  There are no active problems to display for this patient.   Past Medical History:  Diagnosis Date  . Acne    Accutane 2013  . Allergy    seasonal   . Asthma    exercise induced  . 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 Herpes labialis   . Lactose intolerance     Past Surgical History:  Procedure Laterality Date  . CYST REMOVAL TRUNK  10/2012   mid breast area  . Dental implants  2009  . Mirena   10/07 and 10/12/10    Social History   Tobacco Use  . Smoking status: Never Smoker  . Smokeless tobacco: Never Used  Substance Use Topics  . Alcohol use: Yes    Alcohol/week: 0.6 oz    Types: 1 Glasses of wine per week    Comment: 1-2 drinks a week  . Drug use: No    Family History  Problem Relation Age of Onset  . Stroke Father   . Hypertension Father   . Hyperlipidemia Father   . Thyroid disease Sister   . Ovarian cysts Sister   . Cancer Maternal Grandfather        lung  . Other Paternal Grandmother        hip replacemnt  . Alzheimer's disease Paternal Grandfather     No Known Allergies  Medication list has been reviewed and updated.  Current Outpatient Medications on File Prior to Visit  Medication Sig Dispense Refill  . albuterol  (PROVENTIL) (5 MG/ML) 0.5% nebulizer solution Take 2.5 mg by nebulization every 6 (six) hours as needed.    Marland Kitchen. levonorgestrel (MIRENA) 20 MCG/24HR IUD 1 each by Intrauterine route once.    . Multiple Vitamin (MULTIVITAMIN) capsule Take 1 capsule by mouth daily.    Marland Kitchen. sulfamethoxazole-trimethoprim (BACTRIM DS) 800-160 MG per tablet Take 1 tablet by mouth 2 (two) times daily. 20 tablet 0   No current facility-administered medications on file prior to visit.     ROS ROS otherwise unremarkable unless listed above.  Physical Examination: BP 115/76   Pulse 67   Temp 98.2 F (36.8 C) (Oral)   Resp 16   Ht 5\' 7"  (1.702 m)   Wt 212 lb (96.2 kg)   SpO2 96%   BMI 33.20 kg/m  Ideal Body Weight: Weight in (lb) to have BMI = 25: 159.3  Physical Exam  Constitutional: She is oriented to person, place, and time. She appears well-developed and well-nourished. No distress.  HENT:  Head: Normocephalic and atraumatic.  Right Ear: External ear normal.  Left Ear: External ear normal.  Eyes: Pupils are equal, round, and reactive to light. Conjunctivae and EOM are normal.  Cardiovascular: Normal rate.  Pulmonary/Chest: Effort normal. No respiratory distress.  Musculoskeletal:       Right foot: There is tenderness (erythema locaized around the wound at the tarsal area of 1 and 2 plantar.  there is medial erythema that stretches about 2cm ). There is normal range of motion, no bony tenderness, no swelling, normal capillary refill and no crepitus.  Neurological: She is alert and oriented to person, place, and time.  Skin: She is not diaphoretic.  Psychiatric: She has a normal mood and affect. Her behavior is normal.    No results found. Dg Foot Complete Right  Result Date: 11/27/2017 CLINICAL DATA:  56 y/o  F; stepped on a nail with plantar area pain. EXAM: RIGHT FOOT COMPLETE - 3+ VIEW COMPARISON:  None. FINDINGS: There is no evidence of fracture or dislocation. There is no evidence of arthropathy or  other focal bone abnormality. No radiopaque foreign body identified. IMPRESSION: Negative. Electronically Signed   By: Mitzi Hansen M.D.   On: 11/27/2017 14:30     Assessment and Plan: Amy Hull is a 37 y.o. female who is here today for cc of  Chief Complaint  Patient presents with  . Foot Injury    pt stepped on nail/ x 1 day rt foot  I am starting her on both Augmentin and Bactrim at this time.  States that she has had side effects of the doxycycline.  Due to lack of assessment of how deep the wound was and the multiple pharmacotherapies, I am referring her to podiatry for follow-up of this.  We are not covering superior Pseudomonas at this time, weighing the risk benefit of multiple pharmacotherapies, which can be concerning however will look to see if there are any recommendations from foot doctor. Puncture wound of right foot, initial encounter - Plan: amoxicillin-clavulanate (AUGMENTIN) 875-125 MG tablet, ondansetron (ZOFRAN-ODT) 8 MG disintegrating tablet, Ambulatory referral to Podiatry, sulfamethoxazole-trimethoprim (BACTRIM DS,SEPTRA DS) 800-160 MG tablet, Tdap vaccine greater than or equal to 7yo IM, traMADol (ULTRAM) 50 MG tablet, DISCONTINUED: doxycycline (VIBRAMYCIN) 100 MG capsule  Injury of right foot, initial encounter - Plan: DG Foot Complete Right, amoxicillin-clavulanate (AUGMENTIN) 875-125 MG tablet, ondansetron (ZOFRAN-ODT) 8 MG disintegrating tablet, Ambulatory referral to Podiatry, sulfamethoxazole-trimethoprim (BACTRIM DS,SEPTRA DS) 800-160 MG tablet, Tdap vaccine greater than or equal to 7yo IM, traMADol (ULTRAM) 50 MG tablet, DISCONTINUED: doxycycline (VIBRAMYCIN) 100 MG capsule, CANCELED: Td vaccine greater than or equal to 7yo preservative free IM  Trena Platt, PA-C [facility] 4/2/20199:33 AM

## 2017-11-27 NOTE — Patient Instructions (Addendum)
I would like you to do warm soaks three times per day for 15 minutes.  We are going to keep this wound soft, keep infection from staying in the foot. I am referring you to podiatry.  Please await this contact.    Puncture Wound A puncture wound is an injury that is caused by a sharp, thin object that goes through your skin, such as a nail. A puncture wound usually does not leave a large opening in your skin, so it may not bleed a lot. However, when you get a puncture wound, dirt or other materials (foreign bodies) can be forced into your wound and break off inside. This makes it more likely that an infection will happen, such as tetanus. Follow these instructions at home: Medicines  Take or apply over-the-counter and prescription medicines only as told by your doctor.  If you were prescribed an antibiotic medicine, take or apply it as told by your doctor. Do not stop using the antibiotic even if your condition starts to get better. Wound care  There are many ways to close and cover a wound. For example, a wound can be covered with stitches (sutures), skin glue, or adhesive strips. Follow instructions from your doctor about: ? How to take care of your wound. ? When and how you should change your bandage (dressing). ? When you should remove your bandage. ? Removing whatever was used to close your wound.  Keep the bandage dry as told by your doctor. Do not take baths, swim, use a hot tub, or do anything that would put your wound underwater until your doctor says it is okay.  Clean the wound as told by your doctor.  Do not scratch or pick at the wound.  Check your wound every day for signs of infection. Watch for: ? Redness, swelling, or pain. ? Fluid, blood, or pus. General instructions  Raise (elevate) the injured area above the level of your heart while you are sitting or lying down.  If your puncture wound is in your foot, ask your doctor if you need to avoid putting weight on your  foot and for how long.  Keep all follow-up visits as told by your doctor. This is important. Contact a doctor if:  You got a tetanus shot and you have any of these problems at the injection site: ? Swelling. ? Very bad pain. ? Redness. ? Bleeding.  You have a fever.  Your stitches come out.  You notice a bad smell coming from your wound or your bandage.  You notice something coming out of the wound, such as wood or glass.  Medicine does not help your pain.  You have more redness, swelling, or pain at the site of your wound.  You have fluid, blood, or pus coming from your wound.  You notice a change in the color of your skin near your wound.  You need to change the bandage often because fluid, blood, or pus is coming from the wound.  You start to have a new rash.  You start to have numbness around the wound. Get help right away if:  You have very bad swelling around the wound.  Your pain suddenly gets worse and is very bad.  You start to get painful skin lumps.  You have a red streak going away from your wound.  The wound is on your hand or foot and you cannot move a finger or toe like you usually can.  The wound is on your  hand or foot and you notice that your fingers or toes look pale or bluish. This information is not intended to replace advice given to you by your health care provider. Make sure you discuss any questions you have with your health care provider. Document Released: 05/24/2008 Document Revised: 01/21/2016 Document Reviewed: 10/08/2014 Elsevier Interactive Patient Education  2018 [employer]     IF you received an x-ray today, you will receive an invoice from [facility]. Please contact [facility] at [phone] with questions or concerns regarding your invoice.   IF you received labwork today, you will receive an invoice from [facility]. Please contact [facility] at [phone] with questions or concerns regarding your  invoice.   Our billing staff will not be able to assist you with questions regarding bills from these companies.  You will be contacted with the lab results as soon as they are available. The fastest way to get your results is to activate your My Chart account. Instructions are located on the last page of this paperwork. If you have not heard from us regarding the results in 2 weeks, please contact this office.

## 2017-11-29 ENCOUNTER — Telehealth: Payer: Self-pay | Admitting: Physician Assistant

## 2017-11-29 NOTE — Telephone Encounter (Signed)
Mychart message sent to pt about changing providers due to [facility] being out on 4/6 sent pt a mychart message letting them knw

## 2017-12-02 ENCOUNTER — Ambulatory Visit: Payer: BC Managed Care – PPO | Admitting: Urgent Care

## 2017-12-06 ENCOUNTER — Encounter: Payer: Self-pay | Admitting: Physician Assistant

## 2017-12-27 ENCOUNTER — Encounter: Payer: Self-pay | Admitting: Family Medicine

## 2017-12-27 ENCOUNTER — Ambulatory Visit: Payer: BC Managed Care – PPO | Admitting: Family Medicine

## 2017-12-27 ENCOUNTER — Other Ambulatory Visit: Payer: Self-pay

## 2017-12-27 VITALS — BP 113/75 | HR 77 | Temp 98.7°F | Resp 16 | Ht 67.0 in | Wt 211.4 lb

## 2017-12-27 DIAGNOSIS — Z803 Family history of malignant neoplasm of breast: Secondary | ICD-10-CM

## 2017-12-27 DIAGNOSIS — L732 Hidradenitis suppurativa: Secondary | ICD-10-CM | POA: Diagnosis not present

## 2017-12-27 DIAGNOSIS — Z1231 Encounter for screening mammogram for malignant neoplasm of breast: Secondary | ICD-10-CM | POA: Diagnosis not present

## 2017-12-27 NOTE — Patient Instructions (Addendum)
   IF you received an x-ray today, you will receive an invoice from [facility]. Please contact [facility] at [phone] with questions or concerns regarding your invoice.   IF you received labwork today, you will receive an invoice from [facility]. Please contact [facility] at [phone] with questions or concerns regarding your invoice.   Our billing staff will not be able to assist you with questions regarding bills from these companies.  You will be contacted with the lab results as soon as they are available. The fastest way to get your results is to activate your My Chart account. Instructions are located on the last page of this paperwork. If you have not heard from us regarding the results in 2 weeks, please contact this office.      Fibrocystic Breast Changes Fibrocystic breast changes are changes in breast tissue that can cause breasts to become swollen, lumpy, or painful. This can happen due to buildup of scar-like tissue (fibrous tissue) or the forming of fluid-filled lumps (cysts) in the breast. This is a common condition, and it is not cancerous (is benign). The exact cause is not known, but it seems to occur when women go through hormonal changes during their menstrual cycle. Fibrocystic breast changes can affect one or both breasts. What are the causes? The exact cause of fibrocystic breast changes is not known. However, this condition:  May be related to the female hormones estrogen and progesterone.  May be influenced by family traits that get passed from parent to child (genetics). What are the signs or symptoms? Symptoms of this condition may affect one or both breasts, and may include:  Tenderness, mild discomfort, or pain.  Swelling.  Rope-like tissue that can be felt when touching the breast.  Lumps in one or both breasts.  Changes in breast size. Breasts may get larger before the menstrual period and smaller after the menstrual  period.  Green or dark brown discharge from the nipple. Symptoms are usually worse before menstrual periods start, and they get better toward the end of menstrual periods. How is this diagnosed? This condition is diagnosed based on your medical history and a physical exam of your breasts. You may also have tests, such as:  A breast X-ray (mammogram).  Ultrasound of your breasts.  MRI.  Removal of a breast tissue sample for testing (breast biopsy). This may be done if your health care provider thinks that something else may be causing changes in your breasts. How is this treated? Often, treatment is not needed for this condition. In some cases, treatment may include:  Taking over-the-counter pain relievers to help lessen pain or discomfort.  Limiting or avoiding caffeine. Foods and beverages that contain caffeine include chocolate, soda, coffee, and tea.  Reducing sugar and fat in your diet. Your health care provider may also recommend:  A procedure to remove fluid from a cyst that is causing pain (fine needle aspiration).  Surgery to remove a cyst that is large or tender or does not go away. Follow these instructions at home:  Examine your breasts after every menstrual period. If you do not have menstrual periods, check your breasts on the first day of every month. Feel for changes in your breasts, such as:  More tenderness.  A new growth.  A change in size.  A change in an existing lump.  Take over-the-counter and prescription medicines only as told by your health care provider.  Wear a well-fitted support or sports bra, especially when exercising.    Decrease or avoid caffeine, fat, and sugar in your diet as directed by your health care provider. Contact a health care provider if:  You have fluid leaking from your nipple, especially if it is bloody.  You have new lumps or bumps in your breast.  Your breast becomes enlarged, red, and painful.  You have areas of your  breast that pucker inward.  Your nipple appears flat or indented. Get help right away if:  You have redness of your breast and the redness is spreading. Summary  Fibrocystic breast changes are changes in breast tissue that can cause breasts to become swollen, lumpy, or painful.  This condition may be related to the female hormones estrogen and progesterone.  With this condition, it is important to examine your breasts after every menstrual period. If you do not have menstrual periods, check your breasts on the first day of every month. This information is not intended to replace advice given to you by your health care provider. Make sure you discuss any questions you have with your health care provider. Document Released: 06/01/2006 Document Revised: 04/26/2016 Document Reviewed: 04/13/2016 Elsevier Interactive Patient Education  2017 Elsevier Inc.  

## 2017-12-27 NOTE — Progress Notes (Signed)
Chief Complaint  Patient presents with  . Referral    rheumatologist, mammogram    HPI  Patient with a history of recurrent skin boils is here to find out who can help her. She has a dermatologist but is wondering if a rheumatologist could treat the boils. She was wondering if her boils were from an autoimmune disorder. Her dermatologist treated this in the past.   Her mother has breast cancer diagnosed at 37 yo and she wanted to start early with mammograms    Past Medical History:  Diagnosis Date  . Acne    Accutane 2013  . Allergy    seasonal   . Asthma    exercise induced  . 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 Herpes labialis   . Lactose intolerance     Current Outpatient Medications  Medication Sig Dispense Refill  . albuterol (PROVENTIL) (5 MG/ML) 0.5% nebulizer solution Take 2.5 mg by nebulization every 6 (six) hours as needed.    Marland Kitchen levonorgestrel (MIRENA) 20 MCG/24HR IUD 1 each by Intrauterine route once.    . Multiple Vitamin (MULTIVITAMIN) capsule Take 1 capsule by mouth daily.     No current facility-administered medications for this visit.     Allergies: No Known Allergies  Past Surgical History:  Procedure Laterality Date  . CYST REMOVAL TRUNK  10/2012   mid breast area  . Dental implants  2009  . Mirena   10/07 and 10/12/10    Social History   Socioeconomic History  . Marital status: Single    Spouse name: Not on file  . Number of children: Not on file  . Years of education: Not on file  . Highest education level: Not on file  Occupational History  . Not on file  Social Needs  . Financial resource strain: Not on file  . Food insecurity:    Worry: Not on file    Inability: Not on file  . Transportation needs:    Medical: Not on file    Non-medical: Not on file  Tobacco Use  . Smoking status: Never Smoker  . Smokeless tobacco: Never Used  Substance and Sexual Activity  . Alcohol use: Yes    Alcohol/week: 0.6 oz    Types:  1 Glasses of wine per week    Comment: 1-2 drinks a week  . Drug use: No  . Sexual activity: Yes    Partners: Male    Birth control/protection: IUD    Comment: 1 sexual partner in last 12 months  Lifestyle  . Physical activity:    Days per week: Not on file    Minutes per session: Not on file  . Stress: Not on file  Relationships  . Social connections:    Talks on phone: Not on file    Gets together: Not on file    Attends religious service: Not on file    Active member of club or organization: Not on file    Attends meetings of clubs or organizations: Not on file    Relationship status: Not on file  Other Topics Concern  . Not on file  Social History Narrative   Single.    Family History  Problem Relation Age of Onset  . Stroke Father   . Hypertension Father   . Hyperlipidemia Father   . Thyroid disease Sister   . Ovarian cysts Sister   . Cancer Maternal Grandfather        lung  . Other Paternal  Grandmother        hip replacemnt  . Alzheimer's disease Paternal Grandfather      ROS Review of Systems See HPI Constitution: No fevers or chills No malaise No diaphoresis Skin: No rash or itching Eyes: no blurry vision, no double vision GU: no dysuria or hematuria Neuro: no dizziness or headaches * all others reviewed and negative   Objective: Vitals:   12/27/17 1627  BP: 113/75  Pulse: 77  Resp: 16  Temp: 98.7 F (37.1 C)  TempSrc: Oral  SpO2: 100%  Weight: 211 lb 6.4 oz (95.9 kg)  Height:  (1.702 m)    Physical Exam  Constitutional: She is oriented to person, place, and time. She appears well-developed and well-nourished.  HENT:  Head: Normocephalic and atraumatic.  Pulmonary/Chest: Effort normal.  Neurological: She is alert and oriented to person, place, and time.   Skin: old scars from previous boils on inner thigh No active lesions  Assessment and Plan Bennette was seen today for referral.  Diagnoses and all orders for this  visit:  Suppurative hidradenitis- follow up with [facility]  Breast cancer screening by mammogram -     MM Digital Diagnostic Bilat; Future  Family history of breast cancer in mother -     MM Digital Diagnostic Bilat; Future  Discussed breast cancer screening guidelines Currently asymptomatic    Eivan Gallina A Creta Levin

## 2018-01-12 DIAGNOSIS — L732 Hidradenitis suppurativa: Secondary | ICD-10-CM | POA: Insufficient documentation

## 2018-03-07 ENCOUNTER — Encounter: Payer: Self-pay | Admitting: Family Medicine

## 2018-03-07 ENCOUNTER — Ambulatory Visit: Payer: BC Managed Care – PPO | Admitting: Family Medicine

## 2018-03-07 ENCOUNTER — Other Ambulatory Visit: Payer: Self-pay

## 2018-03-07 VITALS — BP 120/72 | HR 83 | Temp 99.2°F | Resp 18 | Ht 67.0 in | Wt 210.4 lb

## 2018-03-07 DIAGNOSIS — J45990 Exercise induced bronchospasm: Secondary | ICD-10-CM

## 2018-03-07 DIAGNOSIS — R059 Cough, unspecified: Secondary | ICD-10-CM

## 2018-03-07 DIAGNOSIS — R0982 Postnasal drip: Secondary | ICD-10-CM

## 2018-03-07 DIAGNOSIS — R05 Cough: Secondary | ICD-10-CM | POA: Diagnosis not present

## 2018-03-07 MED ORDER — MOMETASONE FUROATE 50 MCG/ACT NA SUSP: 2.0000 | Freq: Every day | NASAL | 12 refills | Status: AC

## 2018-03-07 MED ORDER — ALBUTEROL SULFATE HFA 108 (90 BASE) MCG/ACT IN AERS: 2.0000 | INHALATION_SPRAY | Freq: Four times a day (QID) | RESPIRATORY_TRACT | 1 refills | Status: DC | PRN

## 2018-03-07 MED ORDER — LEVOCETIRIZINE DIHYDROCHLORIDE 5 MG PO TABS: 5.0000 mg | ORAL_TABLET | Freq: Every evening | ORAL | 1 refills | Status: DC

## 2018-03-07 NOTE — Progress Notes (Signed)
Chief Complaint  Patient presents with  . Cough    runs out of breath easy x47month    HPI   One month history of cough Reports that she has exercise induced asthma She reports that she gets more coughing when she goes to bed she gets postnasal drip and feels like she coughs up phlegm at night She coughs until the back of her throat hurts Sometimes she wheezes especially after activity  She denies fevers or chills She denies dizziness  She reports that she has seasonal allergies and has been taking claritin daily since March and flonase She uses her albuterol with exercise  She would like a refill for that She has not been exercise because she cannot breathe She tried guaifenesin which suppressed some of the cough    Past Medical History:  Diagnosis Date  . Acne    Accutane 2013  . Allergy    seasonal   . Asthma    exercise induced  . 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 Herpes labialis   . Lactose intolerance     Current Outpatient Medications  Medication Sig Dispense Refill  . albuterol (PROVENTIL) (5 MG/ML) 0.5% nebulizer solution Take 2.5 mg by nebulization every 6 (six) hours as needed.    Marland Kitchen levonorgestrel (MIRENA) 20 MCG/24HR IUD 1 each by Intrauterine route once.    . Multiple Vitamin (MULTIVITAMIN) capsule Take 1 capsule by mouth daily.    Marland Kitchen albuterol (PROVENTIL HFA;VENTOLIN HFA) 108 (90 Base) MCG/ACT inhaler Inhale 2 puffs into the lungs every 6 (six) hours as needed for wheezing or shortness of breath. 1 Inhaler 1  . levocetirizine (XYZAL) 5 MG tablet Take 1 tablet (5 mg total) by mouth every evening. 30 tablet 1  . mometasone (NASONEX) 50 MCG/ACT nasal spray Place 2 sprays into the nose daily. 17 g 12   No current facility-administered medications for this visit.     Allergies: No Known Allergies  Past Surgical History:  Procedure Laterality Date  . CYST REMOVAL TRUNK  10/2012   mid breast area  . Dental implants  2009  . Mirena    10/07 and 10/12/10    Social History   Socioeconomic History  . Marital status: Single    Spouse name: Not on file  . Number of children: Not on file  . Years of education: Not on file  . Highest education level: Not on file  Occupational History  . Not on file  Social Needs  . Financial resource strain: Not on file  . Food insecurity:    Worry: Not on file    Inability: Not on file  . Transportation needs:    Medical: Not on file    Non-medical: Not on file  Tobacco Use  . Smoking status: Never Smoker  . Smokeless tobacco: Never Used  Substance and Sexual Activity  . Alcohol use: Yes    Alcohol/week: 0.6 oz    Types: 1 Glasses of wine per week    Comment: 1-2 drinks a week  . Drug use: No  . Sexual activity: Yes    Partners: Male    Birth control/protection: IUD    Comment: 1 sexual partner in last 12 months  Lifestyle  . Physical activity:    Days per week: Not on file    Minutes per session: Not on file  . Stress: Not on file  Relationships  . Social connections:    Talks on phone: Not on file  Gets together: Not on file    Attends religious service: Not on file    Active member of club or organization: Not on file    Attends meetings of clubs or organizations: Not on file    Relationship status: Not on file  Other Topics Concern  . Not on file  Social History Narrative   Single.    Family History  Problem Relation Age of Onset  . Stroke Father   . Hypertension Father   . Hyperlipidemia Father   . Thyroid disease Sister   . Ovarian cysts Sister   . Cancer Maternal Grandfather        lung  . Other Paternal Grandmother        hip replacemnt  . Alzheimer's disease Paternal Grandfather      ROS Review of Systems See HPI Constitution: No fevers or chills No malaise No diaphoresis Skin: No rash or itching Eyes: no blurry vision, no double vision GU: no dysuria or hematuria Neuro: no dizziness or headaches all others reviewed and negative     Objective: Vitals:   03/07/18 1540  BP: 120/72  Pulse: 83  Resp: 18  Temp: 99.2 F (37.3 C)  TempSrc: Oral  SpO2: 99%  Weight: 210 lb 6.4 oz (95.4 kg)  Height: 5\' 7"  (1.702 m)    Physical Exam General: alert, oriented, in NAD Head: normocephalic, atraumatic, no sinus tenderness Eyes: EOM intact, no scleral icterus or conjunctival injection Ears: TM clear bilaterally Nose: mucosa nonerythematous, nonedematous Throat: no pharyngeal exudate or erythema Lymph: no posterior auricular, submental or cervical lymph adenopathy Heart: normal rate, normal sinus rhythm, no murmurs Lungs: clear to auscultation bilaterally, no wheezing   Assessment and Plan Amy RuizLeah was seen today for cough.  Diagnoses and all orders for this visit:  Cough Postnasal drip Exercise-induced asthma  Discussed that her pulm exam was normal Supportive care Use albuterol prn cough  If she needs it daily return to clinic  Stop guaifenesin as it is ineffective  -     albuterol (PROVENTIL HFA;VENTOLIN HFA) 108 (90 Base) MCG/ACT inhaler; Inhale 2 puffs into the lungs every 6 (six) hours as needed for wheezing or shortness of breath. -     levocetirizine (XYZAL) 5 MG tablet; Take 1 tablet (5 mg total) by mouth every evening. -     mometasone (NASONEX) 50 MCG/ACT nasal spray; Place 2 sprays into the nose daily.     Amy Hull A Sabre Romberger

## 2018-03-07 NOTE — Patient Instructions (Addendum)
Change the cetrizine to Xyzal Change flonase to nasonex Use albuterol inhaler before going to sleep at night   IF you received an x-ray today, you will receive an invoice from [facility]. Please contact [facility] at [phone] with questions or concerns regarding your invoice.   IF you received labwork today, you will receive an invoice from [facility]. Please contact [facility] at [phone] with questions or concerns regarding your invoice.   Our billing staff will not be able to assist you with questions regarding bills from these companies.  You will be contacted with the lab results as soon as they are available. The fastest way to get your results is to activate your My Chart account. Instructions are located on the last page of this paperwork. If you have not heard from [place] regarding the results in 2 weeks, please contact this office.     Postnasal Drip Postnasal drip is the feeling of mucus going down the back of your throat. Mucus is a slimy substance that moistens and cleans your nose and throat, as well as the air pockets in face bones near your forehead and cheeks (sinuses). Small amounts of mucus pass from your nose and sinuses down the back of your throat all the time. This is normal. When you produce too much mucus or the mucus gets too thick, you can feel it. Some common causes of postnasal drip include:  Having more mucus because of: ? A cold or the flu. ? Allergies. ? Cold air. ? Certain medicines.  Having more mucus that is thicker because of: ? A sinus or nasal infection. ? Dry air. ? A food allergy.  Follow these instructions at home: Relieving discomfort  Gargle with a salt-water mixture 3-4 times a day or as needed. To make a salt-water mixture, completely dissolve -1 tsp of salt in 1 cup of warm water.  If the air in your home is dry, use a humidifier to add moisture to the air.  Use a saline spray or container (neti pot) to flush out  the nose (nasal irrigation). These methods can help clear away mucus and keep the nasal passages moist. General instructions  Take over-the-counter and prescription medicines only as told by your health care provider.  Follow instructions from your health care provider about eating or drinking restrictions. You may need to avoid caffeine.  Avoid things that you know you are allergic to (allergens), like dust, mold, pollen, pets, or certain foods.  Drink enough fluid to keep your urine pale yellow.  Keep all follow-up visits as told by your health care provider. This is important. Contact a health care provider if:  You have a fever.  You have a sore throat.  You have difficulty swallowing.  You have headache.  You have sinus pain.  You have a cough that does not go away.  The mucus from your nose becomes thick and is green or yellow in color.  You have cold or flu symptoms that last more than 10 days. Summary  Postnasal drip is the feeling of mucus going down the back of your throat.  If your health care provider approves, use nasal irrigation or a nasal spray 2?4 times a day.  Avoid things that you know you are allergic to (allergens), like dust, mold, pollen, pets, or certain foods. This information is not intended to replace advice given to you by your health care provider. Make sure you discuss any questions you have with your health care provider. Document  Released: 11/28/2016 Document Revised: 11/28/2016 Document Reviewed: 11/28/2016 Elsevier Interactive Patient Education  [employer]2018 Elsevier Inc.

## 2018-03-29 ENCOUNTER — Other Ambulatory Visit: Payer: Self-pay | Admitting: Family Medicine

## 2018-05-02 ENCOUNTER — Other Ambulatory Visit: Payer: Self-pay | Admitting: Family Medicine

## 2018-08-27 ENCOUNTER — Ambulatory Visit: Payer: BC Managed Care – PPO | Admitting: Family Medicine

## 2018-08-27 ENCOUNTER — Other Ambulatory Visit: Payer: Self-pay

## 2018-08-27 ENCOUNTER — Encounter: Payer: Self-pay | Admitting: Family Medicine

## 2018-08-27 VITALS — BP 120/81 | HR 74 | Temp 98.3°F | Ht 67.0 in | Wt 210.4 lb

## 2018-08-27 DIAGNOSIS — R112 Nausea with vomiting, unspecified: Secondary | ICD-10-CM | POA: Diagnosis not present

## 2018-08-27 DIAGNOSIS — K219 Gastro-esophageal reflux disease without esophagitis: Secondary | ICD-10-CM

## 2018-08-27 DIAGNOSIS — R1011 Right upper quadrant pain: Secondary | ICD-10-CM | POA: Diagnosis not present

## 2018-08-27 DIAGNOSIS — R1013 Epigastric pain: Secondary | ICD-10-CM

## 2018-08-27 DIAGNOSIS — K5900 Constipation, unspecified: Secondary | ICD-10-CM | POA: Diagnosis not present

## 2018-08-27 LAB — CBC WITH DIFFERENTIAL/PLATELET
Basophils Absolute: 0 10*3/uL (ref 0.0–0.2)
Basos: 1 %
EOS (ABSOLUTE): 0.1 10*3/uL (ref 0.0–0.4)
Eos: 1 %
Hematocrit: 37.3 % (ref 34.0–46.6)
Hemoglobin: 12.4 g/dL (ref 11.1–15.9)
IMMATURE GRANULOCYTES: 0 %
Immature Grans (Abs): 0 10*3/uL (ref 0.0–0.1)
Lymphocytes Absolute: 1.5 10*3/uL (ref 0.7–3.1)
Lymphs: 24 %
MCH: 28.9 pg (ref 26.6–33.0)
MCHC: 33.2 g/dL (ref 31.5–35.7)
MCV: 87 fL (ref 79–97)
Monocytes Absolute: 0.4 10*3/uL (ref 0.1–0.9)
Monocytes: 7 %
NEUTROS PCT: 67 %
Neutrophils Absolute: 4.3 10*3/uL (ref 1.4–7.0)
PLATELETS: 324 10*3/uL (ref 150–450)
RBC: 4.29 x10E6/uL (ref 3.77–5.28)
RDW: 12.3 % (ref 12.3–15.4)
WBC: 6.3 10*3/uL (ref 3.4–10.8)

## 2018-08-27 LAB — COMPREHENSIVE METABOLIC PANEL
ALT: 62 IU/L — ABNORMAL HIGH (ref 0–32)
AST: 43 IU/L — ABNORMAL HIGH (ref 0–40)
Albumin/Globulin Ratio: 1.8 (ref 1.2–2.2)
Albumin: 4.2 g/dL (ref 3.5–5.5)
Alkaline Phosphatase: 62 IU/L (ref 39–117)
BUN/Creatinine Ratio: 18 (ref 9–23)
BUN: 14 mg/dL (ref 6–20)
Bilirubin Total: 0.3 mg/dL (ref 0.0–1.2)
CO2: 21 mmol/L (ref 20–29)
Calcium: 8.8 mg/dL (ref 8.7–10.2)
Chloride: 100 mmol/L (ref 96–106)
Creatinine, Ser: 0.8 mg/dL (ref 0.57–1.00)
GFR calc Af Amer: 109 mL/min/{1.73_m2} (ref >59)
GFR calc non Af Amer: 94 mL/min/{1.73_m2} (ref >59)
Globulin, Total: 2.3 g/dL (ref 1.5–4.5)
Glucose: 97 mg/dL (ref 65–99)
Potassium: 4.1 mmol/L (ref 3.5–5.2)
Sodium: 136 mmol/L (ref 134–144)
Total Protein: 6.5 g/dL (ref 6.0–8.5)

## 2018-08-27 LAB — POCT URINALYSIS DIP (MANUAL ENTRY)
Bilirubin, UA: NEGATIVE
Glucose, UA: NEGATIVE mg/dL
Ketones, POC UA: NEGATIVE mg/dL
Leukocytes, UA: NEGATIVE
Nitrite, UA: NEGATIVE
Protein Ur, POC: NEGATIVE mg/dL
Spec Grav, UA: 1.01 (ref 1.010–1.025)
UROBILINOGEN UA: 0.2 U/dL
pH, UA: 7 (ref 5.0–8.0)

## 2018-08-27 LAB — LIPASE: Lipase: 33 U/L (ref 14–72)

## 2018-08-27 MED ORDER — ONDANSETRON 4 MG PO TBDP: ORAL_TABLET | ORAL | 0 refills | Status: AC

## 2018-08-27 MED ORDER — OMEPRAZOLE 40 MG PO CPDR: 40.0000 mg | DELAYED_RELEASE_CAPSULE | Freq: Every day | ORAL | 0 refills | Status: DC

## 2018-08-27 NOTE — Progress Notes (Signed)
Patient ID: [name], female    DOB: [birth year]  Age: 37 y.o. MRN: [id number]  Chief Complaint  Patient presents with  . Emesis    for 1 wk, feels liked GERD. Has feeling this way sine Christmas. Lots of belching. Taking Prilosec  . Nausea    Subjective:   37 year old lady has had a week history of symptoms of abdominal discomfort, nausea and vomiting.  She has had a reflux-like sensation up into her chest.  She has had pain and bloating in her abdomen.  She had more acute pain at one point in the right upper quadrant.  She vomited a week ago when this started.  Then 2 days later she ate too much at a gathering and later vomiting again.  She is persisted with a lot of discomfort symptoms.  She has been constipated also.  No history of these kind of problems in the past.  Her mother did have gallbladder disease.  The patient works out at [employer], and had had a hard workout the day to all this started.  Current allergies, medications, problem list, past/family and social histories reviewed.  Objective:  BP 120/81 (BP Location: Left Arm, Patient Position: Sitting, Cuff Size: Normal)   Pulse 74   Temp 98.3 F (36.8 C) (Oral)   Ht 5\' 7"  (1.702 m)   Wt 210 lb 6.4 oz (95.4 kg)   SpO2 96%   BMI 32.95 kg/m   No major acute distress.  Throat clear.  Neck supple without nodes or thyromegaly.  Chest clear.  Heart rate without murmur.  Abdomen soft without masses.  Is a little generalized tenderness but especially in the right upper quadrant.  Results for orders placed or performed in visit on 08/27/18  POCT urinalysis dipstick  Result Value Ref Range   Color, UA yellow yellow   Clarity, UA clear clear   Glucose, UA negative negative mg/dL   Bilirubin, UA negative negative   Ketones, POC UA negative negative mg/dL   Spec Grav, UA [id number] [id number] - 1.025   Blood, UA small (A) negative   pH, UA 7.0 5.0 - 8.0   Protein Ur, POC negative negative mg/dL   Urobilinogen, UA 0.2 0.2 or 1.0 E.U./dL   Nitrite, UA Negative Negative   Leukocytes, UA Negative Negative    Assessment & Plan:   Assessment: 1. Abdominal pain, epigastric   2. RUQ pain   3. Non-intractable vomiting with nausea, unspecified vomiting type   4. Constipation, unspecified constipation type       Plan: This is suspicious in my mind for gallbladder disease, with constipation on top of it.  Orders Placed This Encounter  Procedures  . US Abdomen Complete    Standing Status:   Future    Standing Expiration Date:   10/27/2019    Order Specific Question:   Reason for Exam (SYMPTOM  OR DIAGNOSIS REQUIRED)    Answer:   epigastric, ruq, and mid abdominal pains with reflux, bloating, n/v, and constipation    Order Specific Question:   Preferred imaging location?    Answer:   External    Order Specific Question:   Call Results- Best Contact Number?    Answer:   [phone] (Dr. Alwyn RenHopper cell)  . CBC  . Comprehensive metabolic panel  . Lipase    No orders of the defined types were placed in this encounter.      Two of the liver enzymes a little high, c/w this being GB.  We  wait for ultrasound.  Patient Instructions    Try to stay well-hydrated  Use the ondansetron orally dissolvable tablets every 6 or 8 hours if needed for nausea and vomiting  Try to avoid heavy meals or a lot of fried/fatty foods.  They will work on scheduling you for the ultrasound and you should hear back from them soon, I hope today.  If you do not hear regarding that scheduling by Thursday please call back and speak to the charge nurse.  In the event of acute worsening go to an emergency room.   If you have lab work done today you will be contacted with your lab results within the next 2 weeks.  If you have not heard from us then please contact us. The fastest way to get your results is to register for My Chart.   IF you received an x-ray today, you will receive an invoice from [facility]. Please contact [facility] at [phone] with questions or concerns regarding your invoice.   IF you received labwork today, you will receive an invoice from [facility]. Please contact [facility] at [phone] with questions or concerns regarding your invoice.   Our billing staff will not be able to assist you with questions regarding bills from these companies.  You will be contacted with the lab results as soon as they are available. The fastest way to get your results is to activate your My Chart account. Instructions are located on the last page of this paperwork. If you have not heard from us regarding the results in 2 weeks, please contact this office.        No follow-ups on file.   Janace Hoardavid Hopper, MD 08/27/2018

## 2018-08-27 NOTE — Patient Instructions (Addendum)
  Try to stay well-hydrated  Use the ondansetron orally dissolvable tablets every 6 or 8 hours if needed for nausea and vomiting  Take omeprazole 40 mg 1 daily to reduce stomach acid.  Try to avoid heavy meals or a lot of fried/fatty foods.  They will work on scheduling you for the ultrasound and you should hear back from them soon, I hope today.  If you do not hear regarding that scheduling by Thursday please call back and speak to the charge nurse.  Take a mild laxative such as MiraLAX and Dulcolax or milk of magnesia to try and clean your bowels out a little more.    In the event of acute worsening go to an emergency room.   If you have lab work done today you will be contacted with your lab results within the next 2 weeks.  If you have not heard from us then please contact us. The fastest way to get your results is to register for My Chart.   IF you received an x-ray today, you will receive an invoice from [facility]. Please contact [facility] at [phone] with questions or concerns regarding your invoice.   IF you received labwork today, you will receive an invoice from [facility]. Please contact [facility] at [phone] with questions or concerns regarding your invoice.   Our billing staff will not be able to assist you with questions regarding bills from these companies.  You will be contacted with the lab results as soon as they are available. The fastest way to get your results is to activate your My Chart account. Instructions are located on the last page of this paperwork. If you have not heard from us regarding the results in 2 weeks, please contact this office.    [facility] [address] 08/28/2018 @ 7:40 FASTING- NOTHING TO EAT OR DRINK AFTER MIDNIGHT TONIGHT

## 2018-08-28 ENCOUNTER — Ambulatory Visit
Admission: RE | Admit: 2018-08-28 | Discharge: 2018-08-28 | Disposition: A | Discharge status: Home / Self Care | Payer: BC Managed Care – PPO | Source: Ambulatory Visit | Attending: Family Medicine | Admitting: Family Medicine

## 2018-08-28 ENCOUNTER — Telehealth: Payer: Self-pay | Admitting: Family Medicine

## 2018-08-28 DIAGNOSIS — R1011 Right upper quadrant pain: Secondary | ICD-10-CM

## 2018-08-28 DIAGNOSIS — R1013 Epigastric pain: Secondary | ICD-10-CM

## 2018-08-28 DIAGNOSIS — R112 Nausea with vomiting, unspecified: Secondary | ICD-10-CM

## 2018-08-28 NOTE — Telephone Encounter (Signed)
Copied from CRM [id number]. Topic: General - Other >> Aug 28, 2018  1:15 PM Hull, Amy wrote: Reason for CRM: Pt called in for ultrasound results. Pt requests call back. Cb# [phone]

## 2018-08-30 NOTE — Telephone Encounter (Signed)
Patient checking on the status of ultrasound results, advised patient due to the holiday there's a delay. Patient would like to hear back from a nurse today, please advise

## 2018-08-31 NOTE — Telephone Encounter (Signed)
Pt is requesting Ultrasound results, Dr. Alwyn Ren ordered on 08/28/18.

## 2018-08-31 NOTE — Telephone Encounter (Signed)
Patient is calling back to receive her lab result from the ultrasound. [facility] told her that the results where sent to the [place] office Please advise thank you [phone]

## 2018-09-06 ENCOUNTER — Encounter: Payer: Self-pay | Admitting: Family Medicine

## 2018-09-06 NOTE — Telephone Encounter (Signed)
Results communicated by [employer]

## 2018-09-16 ENCOUNTER — Other Ambulatory Visit: Payer: Self-pay | Admitting: Family Medicine

## 2018-09-16 DIAGNOSIS — K219 Gastro-esophageal reflux disease without esophagitis: Secondary | ICD-10-CM

## 2018-09-17 NOTE — Telephone Encounter (Signed)
Requested Prescriptions  Pending Prescriptions Disp Refills  . omeprazole (PRILOSEC) 40 MG capsule [Pharmacy Med Name: OMEPRAZOLE DR 40 MG CAPSULE] 30 capsule 0    Sig: TAKE 1 CAPSULE BY MOUTH EVERY DAY     Gastroenterology: Proton Pump Inhibitors Passed - 09/16/2018  9:51 AM      Passed - Valid encounter within last 12 months    Recent Outpatient Visits          3 weeks ago Abdominal pain, epigastric   Primary Care at [facility], Sandria Bales, MD   6 months ago Cough   Primary Care at [facility], Manus Rudd, MD   8 months ago Suppurative hidradenitis   Primary Care at [facility], Manus Rudd, MD   9 months ago Puncture wound of right foot, initial encounter   Primary Care at [place], Colbert D, [place]   5 years ago Acute bronchitis   [facility] at [facility], Tessa Lerner, MD

## 2019-01-30 ENCOUNTER — Telehealth: Payer: BC Managed Care – PPO | Admitting: Family

## 2019-01-30 DIAGNOSIS — L255 Unspecified contact dermatitis due to plants, except food: Secondary | ICD-10-CM | POA: Diagnosis not present

## 2019-01-30 MED ORDER — PREDNISONE 10 MG (21) PO TBPK: ORAL_TABLET | ORAL | 0 refills | Status: DC

## 2019-01-30 NOTE — Progress Notes (Signed)

## 2019-02-04 ENCOUNTER — Telehealth: Payer: BC Managed Care – PPO

## 2019-02-04 ENCOUNTER — Ambulatory Visit (INDEPENDENT_AMBULATORY_CARE_PROVIDER_SITE_OTHER)
Admission: RE | Admit: 2019-02-04 | Discharge: 2019-02-04 | Disposition: A | Discharge status: Home / Self Care | Payer: BC Managed Care – PPO | Source: Ambulatory Visit

## 2019-02-04 DIAGNOSIS — L237 Allergic contact dermatitis due to plants, except food: Secondary | ICD-10-CM | POA: Diagnosis not present

## 2019-02-04 DIAGNOSIS — L239 Allergic contact dermatitis, unspecified cause: Secondary | ICD-10-CM

## 2019-02-04 MED ORDER — TRIAMCINOLONE ACETONIDE 0.1 % EX CREA: 1.0000 "application " | TOPICAL_CREAM | Freq: Two times a day (BID) | CUTANEOUS | 0 refills | Status: DC

## 2019-02-04 MED ORDER — PREDNISONE 10 MG PO TABS: 20.0000 mg | ORAL_TABLET | Freq: Two times a day (BID) | ORAL | 0 refills | Status: AC

## 2019-02-04 NOTE — Discharge Instructions (Signed)
We will increase prednisone back up for the next 5 days with 20 mg twice daily with food May also use triamcinolone cream twice daily topically  Zyrtec/Claritin in the morning, Benadryl at night time to further help with itching/allergic process involved Calamine and oatmeal baths to further soothe skin  Follow up if symptoms continuing to persist or worsen

## 2019-02-04 NOTE — ED Provider Notes (Addendum)
Virtual Visit via Video Note:  Mahitha Hickling  initiated request for Telemedicine visit with [facility] team. I connected with Ulyses Jarred  on 02/12/2019 at 10:10 AM  for a synchronized telemedicine visit using a video enabled HIPPA compliant telemedicine application. I verified that I am speaking with Ulyses Jarred  using two identifiers. Secily Walthour C Demaree Liberto, PA-C  was physically located in a [facility] Urgent care site and Itzy Adler was located at a different location.   The limitations of evaluation and management by telemedicine as well as the availability of in-person appointments were discussed. Patient was informed that she  may incur a bill ( including co-pay) for this virtual visit encounter. Aliviya Schoeller  expressed understanding and gave verbal consent to proceed with virtual visit.     History of Present Illness:Amy Hull  is a 38 y.o. female presents with concern over persistent poison ivy.  Patient states that 4 to 5 days ago she was exposed to poison ivy.  She was seen through a virtual visit and given a prednisone taper.  The prednisone has helped with her symptoms, but she is on her last day and concerned about continued symptoms.  She is also used some calamine lotion topically.  She denies any involvement of face or near eyes.  Rash is mainly on upper extremities and right lower leg.  Main symptoms is itching.  Denies any shortness of breath or difficulty breathing.  Denies any other new exposures.  Denies any associated fevers, nausea or vomiting.  Past Medical History:  Diagnosis Date  . Acne    Accutane 2013  . Allergy    seasonal   . Asthma    exercise induced  . 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 Herpes labialis   . Lactose intolerance     Allergies  Allergen Reactions  . Gramineae Pollens   . Pollen Extract         Observations/Objective: Physical Exam  Constitutional: She is oriented to person, place, and time and  well-developed, well-nourished, and in no distress. No distress.  HENT:  Head: Normocephalic and atraumatic.  Eyes: Conjunctivae are normal.  Neck: Normal range of motion. Neck supple.  Pulmonary/Chest: Effort normal. No respiratory distress.  Neurological: She is alert and oriented to person, place, and time. Gait normal.  Skin: Rash noted.  Various areas of erythematous patches to bilateral upper extremities as well as anterior aspect of right lower leg.  No vesicular lesions noted     Assessment and Plan:    ICD-10-CM   1. Allergic contact dermatitis, unspecified trigger  L23.9     Likely contact dermatitis, possible poison ivy exposure. Will length course of prednisone, continue antihistamines, topical kenalog as needed twice daily. Follow up if symptoms continuing to persist or worsen.Discussed strict return precautions. Patient verbalized understanding and is agreeable with plan.   Follow Up Instructions:    I discussed the assessment and treatment plan with the patient. The patient was provided an opportunity to ask questions and all were answered. The patient agreed with the plan and demonstrated an understanding of the instructions.   The patient was advised to call back or seek an in-person evaluation if the symptoms worsen or if the condition fails to improve as anticipated.     Janith Lima, PA-C  02/12/2019 10:10 AM         Janith Lima, PA-C 02/04/19 0937    Debara Pickett C, PA-C 02/12/19 1010  Malasia Torain, NumaHallie C, PA-C 02/12/19 1011

## 2019-03-11 ENCOUNTER — Other Ambulatory Visit: Payer: Self-pay | Admitting: Family Medicine

## 2019-03-11 DIAGNOSIS — K219 Gastro-esophageal reflux disease without esophagitis: Secondary | ICD-10-CM

## 2019-10-16 ENCOUNTER — Encounter: Payer: Self-pay | Admitting: Gastroenterology

## 2019-10-16 ENCOUNTER — Ambulatory Visit: Payer: BC Managed Care – PPO | Admitting: Family Medicine

## 2019-10-16 ENCOUNTER — Other Ambulatory Visit: Payer: Self-pay

## 2019-10-16 VITALS — BP 101/63 | HR 71 | Temp 98.0°F | Ht 67.0 in | Wt 206.0 lb

## 2019-10-16 DIAGNOSIS — R1013 Epigastric pain: Secondary | ICD-10-CM | POA: Diagnosis not present

## 2019-10-16 LAB — POCT URINALYSIS DIP (MANUAL ENTRY)
Bilirubin, UA: NEGATIVE
Glucose, UA: NEGATIVE mg/dL
Ketones, POC UA: NEGATIVE mg/dL
Leukocytes, UA: NEGATIVE
Nitrite, UA: NEGATIVE
Protein Ur, POC: NEGATIVE mg/dL
Spec Grav, UA: 1.02 (ref 1.010–1.025)
Urobilinogen, UA: 0.2 E.U./dL
pH, UA: 7 (ref 5.0–8.0)

## 2019-10-16 LAB — POC MICROSCOPIC URINALYSIS (UMFC)

## 2019-10-16 MED ORDER — ESOMEPRAZOLE MAGNESIUM 40 MG PO CPDR: 40.0000 mg | DELAYED_RELEASE_CAPSULE | Freq: Every day | ORAL | 3 refills | Status: DC

## 2019-10-16 MED ORDER — SUCRALFATE 1 G PO TABS: 1.0000 g | ORAL_TABLET | Freq: Three times a day (TID) | ORAL | 0 refills | Status: AC

## 2019-10-16 NOTE — Progress Notes (Signed)
 Established Patient Office Visit  Subjective:  Patient ID: Amy Hull, female    DOB: 09/20/1980  Age: 38 y.o. MRN: [id number]  CC:  Chief Complaint  Patient presents with  . Abdominal Pain    Return of gallbladder disease/gallstones symptoms: Constant burping. Pain in mid-abdomen on the right side. Increased pain when sitting down. p[t feels bloated and nousus. pt is also having some acid reflux. pt is having a hard time sleeping because of these symptoms    HPI Amy Hull presents for   Pt reports that she is constantly burping She has to sit up to avoid acid reflux She reports that this episode started end of January 2021 She had a similar episode December 2019 She would rate the pain as a 8/10 She has not eaten today and her pain is 4/10 but with continued today  No gastric bypass No vomiting or nausea No coffee ground emesis No unexplained weight loss She is a nonsmoker Both her mother and sister have gallbladder disease  Past Medical History:  Diagnosis Date  . Acne    Accutane 2013  . Allergy    seasonal   . Asthma    exercise induced  . 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 Herpes labialis   . Lactose intolerance     Past Surgical History:  Procedure Laterality Date  . CYST REMOVAL TRUNK  10/2012   mid breast area  . Dental implants  2009  . Mirena   10/07 and 10/12/10    Family History  Problem Relation Age of Onset  . Stroke Father   . Hypertension Father   . Hyperlipidemia Father   . Thyroid disease Sister   . Ovarian cysts Sister   . Cancer Maternal Grandfather        lung  . Other Paternal Grandmother        hip replacemnt  . Alzheimer's disease Paternal Grandfather     Social History   Socioeconomic History  . Marital status: Single    Spouse name: Not on file  . Number of children: Not on file  . Years of education: Not on file  . Highest education level: Not on file  Occupational History  . Not on file   Tobacco Use  . Smoking status: Never Smoker  . Smokeless tobacco: Never Used  Substance and Sexual Activity  . Alcohol use: Yes    Alcohol/week: 1.0 standard drinks    Types: 1 Glasses of wine per week    Comment: 1-2 drinks a week  . Drug use: No  . Sexual activity: Yes    Partners: Male    Birth control/protection: I.U.D.    Comment: 1 sexual partner in last 12 months  Other Topics Concern  . Not on file  Social History Narrative   Single.   Social Determinants of Health   Financial Resource Strain:   . Difficulty of Paying Living Expenses: Not on file  Food Insecurity:   . Worried About Running Out of Food in the Last Year: Not on file  . Ran Out of Food in the Last Year: Not on file  Transportation Needs:   . Lack of Transportation (Medical): Not on file  . Lack of Transportation (Non-Medical): Not on file  Physical Activity:   . Days of Exercise per Week: Not on file  . Minutes of Exercise per Session: Not on file  Stress:   . Feeling of Stress : Not on file  Social   Connections:   . Frequency of Communication with Friends and Family: Not on file  . Frequency of Social Gatherings with Friends and Family: Not on file  . Attends Religious Services: Not on file  . Active Member of Clubs or Organizations: Not on file  . Attends Club or Organization Meetings: Not on file  . Marital Status: Not on file  Intimate Partner Violence:   . Fear of Current or Ex-Partner: Not on file  . Emotionally Abused: Not on file  . Physically Abused: Not on file  . Sexually Abused: Not on file    Outpatient Medications Prior to Visit  Medication Sig Dispense Refill  . albuterol (PROVENTIL HFA;VENTOLIN HFA) 108 (90 Base) MCG/ACT inhaler TAKE 2 PUFFS BY MOUTH EVERY 6 HOURS AS NEEDED FOR WHEEZE OR SHORTNESS OF BREATH 8.5 Inhaler 1  . levonorgestrel (MIRENA) 20 MCG/24HR IUD 1 each by Intrauterine route once.    . mometasone (NASONEX) 50 MCG/ACT nasal spray Place 2 sprays into the nose  daily. 17 g 12  . Multiple Vitamin (MULTIVITAMIN) capsule Take 1 capsule by mouth daily.    . omeprazole (PRILOSEC) 40 MG capsule TAKE 1 CAPSULE BY MOUTH EVERY DAY 30 capsule 0  . ondansetron (ZOFRAN ODT) 4 MG disintegrating tablet Dissolve 1 under tongue every 6 or 8 hours as needed for nausea or vomiting. 20 tablet 0  . albuterol (PROVENTIL) (5 MG/ML) 0.5% nebulizer solution Take 2.5 mg by nebulization every 6 (six) hours as needed.    . triamcinolone cream (KENALOG) 0.1 % Apply 1 application topically 2 (two) times daily. 45 g 0   No facility-administered medications prior to visit.    Allergies  Allergen Reactions  . Gramineae Pollens   . Pollen Extract     ROS Review of Systems    Objective:    Physical Exam  BP 101/63   Pulse 71   Temp 98 F (36.7 C) (Temporal)   Ht 5' 7" (1.702 m)   Wt 206 lb (93.4 kg)   SpO2 97%   BMI 32.26 kg/m  Wt Readings from Last 3 Encounters:  10/16/19 206 lb (93.4 kg)  08/27/18 210 lb 6.4 oz (95.4 kg)  03/07/18 210 lb 6.4 oz (95.4 kg)    Physical Exam  Constitutional: Oriented to person, place, and time. Appears well-developed and well-nourished.  HENT:  Head: Normocephalic and atraumatic.  Eyes: Conjunctivae and EOM are normal.  Cardiovascular: Normal rate, regular rhythm, normal heart sounds and intact distal pulses.  No murmur heard. Pulmonary/Chest: Effort normal and breath sounds normal. No stridor. No respiratory distress. Has no wheezes.  Abdomen: nondistended, normoactive bs, soft, tender in the epigastrium, no murphy's sign, no tenderness at mcburney's point, no rebound or guarding Neurological: Is alert and oriented to person, place, and time.  Skin: Skin is warm. Capillary refill takes less than 2 seconds.  Psychiatric: Has a normal mood and affect. Behavior is normal. Judgment and thought content normal.      CLINICAL DATA:  Epigastric region pain with nausea  EXAM: ABDOMEN ULTRASOUND COMPLETE  COMPARISON:   None.  FINDINGS: Gallbladder: No gallstones or wall thickening visualized. There is no pericholecystic fluid. No sonographic Murphy sign noted by sonographer.  Common bile duct: Diameter: 5 mm. No intrahepatic, common hepatic, or common bile duct dilatation.  Liver: No focal lesion identified. Within normal limits in parenchymal echogenicity. Portal vein is patent on color Doppler imaging with normal direction of blood flow towards the liver.  IVC: No abnormality visualized.    Pancreas: No pancreatic mass or inflammatory focus.  Spleen: Size and appearance within normal limits.  Right Kidney: Length: 11.5 cm. Echogenicity within normal limits. No mass or hydronephrosis visualized.  Left Kidney: Length: 12.0 cm. Echogenicity within normal limits. No mass or hydronephrosis visualized.  Abdominal aorta: No aneurysm visualized.  Other findings: No demonstrable ascites.  IMPRESSION: Study within normal limits.   Electronically Signed   By: Lowella Grip III M.D.   On: 08/28/2018 09:04  Lipase     Component Value Date/Time   LIPASE 33 08/27/2018 1437    There are no preventive care reminders to display for this patient.  There are no preventive care reminders to display for this patient.  No results found for: TSH Lab Results  Component Value Date   WBC 6.3 08/27/2018   HGB 12.4 08/27/2018   HCT 37.3 08/27/2018   MCV 87 08/27/2018   PLT 324 08/27/2018   Lab Results  Component Value Date   NA 136 08/27/2018   K 4.1 08/27/2018   CO2 21 08/27/2018   GLUCOSE 97 08/27/2018   BUN 14 08/27/2018   CREATININE 0.80 08/27/2018   BILITOT 0.3 08/27/2018   ALKPHOS 62 08/27/2018   AST 43 (H) 08/27/2018   ALT 62 (H) 08/27/2018   PROT 6.5 08/27/2018   ALBUMIN 4.2 08/27/2018   CALCIUM 8.8 08/27/2018   No results found for: CHOL No results found for: HDL No results found for: LDLCALC No results found for: TRIG No results found for: CHOLHDL No  results found for: HGBA1C    Assessment & Plan:   Problem List Items Addressed This Visit    None    Visit Diagnoses    Abdominal pain, epigastric    -  Primary   Relevant Orders   POCT urinalysis dipstick (Completed)   POCT Microscopic Urinalysis ([facility])   Ambulatory referral to Gastroenterology   CBC   CMP14+EGFR   Lipase     -    Discussed previous Abdominal US with patient from 2019 which was negative for biliary disease -    Reviewed previous labs as well which did not indicate bilary disease -   Both then and now there is no murphy's sign -   History and physical exam are both suggestive of peptic ulcer disease/gastritis -   Advised pt to start empiric treatment with nexium and carafate -   Discussed EGD and the benefit of that over CT abdomen or repeat US   Meds ordered this encounter  Medications  . sucralfate (CARAFATE) 1 g tablet    Sig: Take 1 tablet (1 g total) by mouth 4 (four) times daily -  with meals and at bedtime.    Dispense:  120 tablet    Refill:  0  . esomeprazole (NEXIUM) 40 MG capsule    Sig: Take 1 capsule (40 mg total) by mouth daily.    Dispense:  30 capsule    Refill:  3    Follow-up: No follow-ups on file.   A total of 25 minutes were spent face-to-face with the patient during this encounter and over half of that time was spent on counseling and coordination of care.   Forrest Moron, MD

## 2019-10-16 NOTE — Patient Instructions (Addendum)
If you have lab work done today you will be contacted with your lab results within the next 2 weeks.  If you have not heard from [place] then please contact us. The fastest way to get your results is to register for My Chart.   IF you received an x-ray today, you will receive an invoice from [facility]. Please contact [facility] at [phone] with questions or concerns regarding your invoice.   IF you received labwork today, you will receive an invoice from [place]. Please contact [facility] at [phone] with questions or concerns regarding your invoice.   Our billing staff will not be able to assist you with questions regarding bills from these companies.  You will be contacted with the lab results as soon as they are available. The fastest way to get your results is to activate your My Chart account. Instructions are located on the last page of this paperwork. If you have not heard from [place] regarding the results in 2 weeks, please contact this office.     Upper Endoscopy, Adult Upper endoscopy is a procedure to look inside the upper GI (gastrointestinal) tract. The upper GI tract is made up of:  The part of the body that moves food from your mouth to your stomach (esophagus).  The stomach.  The first part of your small intestine (duodenum). This procedure is also called esophagogastroduodenoscopy (EGD) or gastroscopy. In this procedure, your health care provider passes a thin, flexible tube (endoscope) through your mouth and down your esophagus into your stomach. A small camera is attached to the end of the tube. Images from the camera appear on a monitor in the exam room. During this procedure, your health care provider may also remove a small piece of tissue to be sent to a lab and examined under a microscope (biopsy). Your health care provider may do an upper endoscopy to diagnose cancers of the upper GI tract. You may also have this procedure to find the cause of  other conditions, such as:  Stomach pain.  Heartburn.  Pain or problems when swallowing.  Nausea and vomiting.  Stomach bleeding.  Stomach ulcers. Tell a health care provider about:  Any allergies you have.  All medicines you are taking, including vitamins, herbs, eye drops, creams, and over-the-counter medicines.  Any problems you or family members have had with anesthetic medicines.  Any blood disorders you have.  Any surgeries you have had.  Any medical conditions you have.  Whether you are pregnant or may be pregnant. What are the risks? Generally, this is a safe procedure. However, problems may occur, including:  Infection.  Bleeding.  Allergic reactions to medicines.  A tear or hole (perforation) in the esophagus, stomach, or duodenum. What happens before the procedure? Staying hydrated Follow instructions from your health care provider about hydration, which may include:  Up to 2 hours before the procedure - you may continue to drink clear liquids, such as water, clear fruit juice, black coffee, and plain tea.  Eating and drinking restrictions Follow instructions from your health care provider about eating and drinking, which may include:  8 hours before the procedure - stop eating heavy meals or foods, such as meat, fried foods, or fatty foods.  6 hours before the procedure - stop eating light meals or foods, such as toast or cereal.  6 hours before the procedure - stop drinking milk or drinks that contain milk.  2 hours before the procedure - stop drinking clear  liquids. Medicines Ask your health care provider about:  Changing or stopping your regular medicines. This is especially important if you are taking diabetes medicines or blood thinners.  Taking medicines such as aspirin and ibuprofen. These medicines can thin your blood. Do not take these medicines unless your health care provider tells you to take them.  Taking over-the-counter  medicines, vitamins, herbs, and supplements. General instructions  Plan to have someone take you home from the hospital or clinic.  If you will be going home right after the procedure, plan to have someone with you for 24 hours.  Ask your health care provider what steps will be taken to help prevent infection. What happens during the procedure?   An IV will be inserted into one of your veins.  You may be given one or more of the following: ? A medicine to help you relax (sedative). ? A medicine to numb the throat (local anesthetic).  You will lie on your left side on an exam table.  Your health care provider will pass the endoscope through your mouth and down your esophagus.  Your health care provider will use the scope to check the inside of your esophagus, stomach, and duodenum. Biopsies may be taken.  The endoscope will be removed. The procedure may vary among health care providers and hospitals. What happens after the procedure?  Your blood pressure, heart rate, breathing rate, and blood oxygen level will be monitored until you leave the hospital or clinic.  Do not drive for 24 hours if you were given a sedative during your procedure.  When your throat is no longer numb, you may be given some fluids to drink.  It is up to you to get the results of your procedure. Ask your health care provider, or the department that is doing the procedure, when your results will be ready. Summary  Upper endoscopy is a procedure to look inside the upper GI tract.  During the procedure, an IV will be inserted into one of your veins. You may be given a medicine to help you relax.  A medicine will be used to numb your throat.  The endoscope will be passed through your mouth and down your esophagus. This information is not intended to replace advice given to you by your health care provider. Make sure you discuss any questions you have with your health care provider. Document Revised:  02/07/2018 Document Reviewed: 01/15/2018 Elsevier Patient Education  2020 [employer].

## 2019-10-17 LAB — CMP14+EGFR
ALT: 14 IU/L (ref 0–32)
AST: 15 IU/L (ref 0–40)
Albumin/Globulin Ratio: 1.7 (ref 1.2–2.2)
Albumin: 4.2 g/dL (ref 3.8–4.8)
Alkaline Phosphatase: 63 IU/L (ref 39–117)
BUN/Creatinine Ratio: 14 (ref 9–23)
BUN: 13 mg/dL (ref 6–20)
Bilirubin Total: <0.2 mg/dL (ref 0.0–1.2)
CO2: 25 mmol/L (ref 20–29)
Calcium: 8.8 mg/dL (ref 8.7–10.2)
Chloride: 103 mmol/L (ref 96–106)
Creatinine, Ser: 0.91 mg/dL (ref 0.57–1.00)
GFR calc Af Amer: 93 mL/min/{1.73_m2} (ref >59)
GFR calc non Af Amer: 80 mL/min/{1.73_m2} (ref >59)
Globulin, Total: 2.5 g/dL (ref 1.5–4.5)
Glucose: 90 mg/dL (ref 65–99)
Potassium: 4.4 mmol/L (ref 3.5–5.2)
Sodium: 138 mmol/L (ref 134–144)
Total Protein: 6.7 g/dL (ref 6.0–8.5)

## 2019-10-17 LAB — CBC
Hematocrit: 37.3 % (ref 34.0–46.6)
Hemoglobin: 12.3 g/dL (ref 11.1–15.9)
MCH: 29.3 pg (ref 26.6–33.0)
MCHC: 33 g/dL (ref 31.5–35.7)
MCV: 89 fL (ref 79–97)
Platelets: 327 10*3/uL (ref 150–450)
RBC: 4.2 x10E6/uL (ref 3.77–5.28)
RDW: 12.2 % (ref 11.7–15.4)
WBC: 6.6 10*3/uL (ref 3.4–10.8)

## 2019-10-17 LAB — LIPASE: Lipase: 34 U/L (ref 14–72)

## 2019-10-21 ENCOUNTER — Ambulatory Visit: Payer: BC Managed Care – PPO | Admitting: Family Medicine

## 2019-11-13 ENCOUNTER — Ambulatory Visit: Payer: BC Managed Care – PPO | Admitting: Gastroenterology

## 2019-11-13 ENCOUNTER — Encounter: Payer: Self-pay | Admitting: Gastroenterology

## 2019-11-13 VITALS — BP 102/68 | HR 72 | Temp 98.6°F | Ht 67.75 in | Wt 206.0 lb

## 2019-11-13 DIAGNOSIS — R1011 Right upper quadrant pain: Secondary | ICD-10-CM

## 2019-11-13 NOTE — Patient Instructions (Signed)
If you are age 39 or older, your body mass index should be between 23-30. Your Body mass index is 31.55 kg/m. If this is out of the aforementioned range listed, please consider follow up with your Primary Care Provider.  If you are age 67 or younger, your body mass index should be between 19-25. Your Body mass index is 31.55 kg/m. If this is out of the aformentioned range listed, please consider follow up with your Primary Care Provider.   You have been scheduled for an abdominal ultrasound at [facility] [place] on 11/19/2019 at 8:00am. Please arrive 15 minutes prior to your appointment for registration. Make certain not to have anything to eat or drink 6 hours prior to your appointment. Should you need to reschedule your appointment, please contact radiology at [phone] . This test typically takes about 30 minutes to perform.  Due to recent changes in healthcare laws, you may see the results of your imaging and laboratory studies on MyChart before your provider has had a chance to review them.  We understand that in some cases there may be results that are confusing or concerning to you. Not all laboratory results come back in the same time frame and the provider may be waiting for multiple results in order to interpret others.  Please give [place] 48 hours in order for your provider to thoroughly review all the results before contacting the office for clarification of your results.

## 2019-11-13 NOTE — Progress Notes (Signed)
HPI: This is a very pleasant 39 year old woman who was referred to me by Doristine Bosworth, MD  to evaluate right upper quadrant pain, belching, bloating.    About a year and a half ago she had some right upper quadrant, epigastric pains and was evaluated by ultrasound.  The ultrasound was completely normal.  The pains went away until recently.  For the past month or 2 now she has had significant right upper quadrant pains associate with nausea and belching.  Eating often made it worse.  She has chronic mild intermittent heartburn and takes omeprazole for that.  She saw her primary care physician who put her on daily scheduled Nexium as well as Carafate and her symptoms have improved somewhat but certainly are not gone.  She rarely takes NSAIDs.  About once per month lately.  2 or 3 months ago which is actually shortly before her symptoms started she was taking Advil PM on a more regular basis to help her sleep with some shoulder pains.  Her weight is down intentionally this past September but she gained some of that back.  She had no bowel issues  Her sister and her mother both had gallbladder problems  No overt GI bleeding  Blood test last month reviewed as below  Old Data Reviewed:  Abdominal US 07/2018 for epig pain; normal examination  Labs 09/2019 cmet, cbc, lipase all normal    Review of systems: Pertinent positive and negative review of systems were noted in the above HPI section. All other review negative.   Past Medical History:  Diagnosis Date  . Acne    Accutane 2013  . Allergy    seasonal   . Anxiety   . Asthma    exercise induced  . 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 Herpes labialis   . HLD (hyperlipidemia)   . Lactose intolerance     Past Surgical History:  Procedure Laterality Date  . CYST REMOVAL TRUNK  10/2012   mid breast area  . Dental implants  2009  . Mirena   10/07 and 10/12/10    Current Outpatient Medications  Medication  Sig Dispense Refill  . albuterol (PROVENTIL HFA;VENTOLIN HFA) 108 (90 Base) MCG/ACT inhaler TAKE 2 PUFFS BY MOUTH EVERY 6 HOURS AS NEEDED FOR WHEEZE OR SHORTNESS OF BREATH 8.5 Inhaler 1  . esomeprazole (NEXIUM) 40 MG capsule Take 1 capsule (40 mg total) by mouth daily. 30 capsule 3  . levonorgestrel (MIRENA) 20 MCG/24HR IUD 1 each by Intrauterine route once.    . mometasone (NASONEX) 50 MCG/ACT nasal spray Place 2 sprays into the nose daily. 17 g 12  . Multiple Vitamin (MULTIVITAMIN) capsule Take 1 capsule by mouth daily.    . sucralfate (CARAFATE) 1 g tablet Take 1 tablet (1 g total) by mouth 4 (four) times daily -  with meals and at bedtime. 120 tablet 0  . ondansetron (ZOFRAN ODT) 4 MG disintegrating tablet Dissolve 1 under tongue every 6 or 8 hours as needed for nausea or vomiting. (Patient not taking: Reported on 11/13/2019) 20 tablet 0   No current facility-administered medications for this visit.    Allergies as of 11/13/2019 - Review Complete 11/13/2019  Allergen Reaction Noted  . Gramineae pollens  08/27/2018  . Pollen extract  08/27/2018    Family History  Problem Relation Age of Onset  . Stroke Father   . Hypertension Father   . Hyperlipidemia Father   . Thyroid disease Sister   .  Ovarian cysts Sister   . Hyperlipidemia Sister   . Lung cancer Maternal Grandfather   . Other Paternal Grandmother        hip replacemnt  . Alzheimer's disease Paternal Grandfather     Social History   Socioeconomic History  . Marital status: Single    Spouse name: Not on file  . Number of children: 0  . Years of education: Not on file  . Highest education level: Not on file  Occupational History  . Occupation: Tree surgeon  Tobacco Use  . Smoking status: Never Smoker  . Smokeless tobacco: Never Used  Substance and Sexual Activity  . Alcohol use: Yes    Alcohol/week: 1.0 standard drinks    Types: 1 Glasses of wine per week    Comment: 1-2 drinks a week, mostly weekends  . Drug  use: No  . Sexual activity: Yes    Partners: Male    Birth control/protection: I.U.D.    Comment: 1 sexual partner in last 12 months  Other Topics Concern  . Not on file  Social History Narrative   Single.   Social Determinants of Health   Financial Resource Strain:   . Difficulty of Paying Living Expenses:   Food Insecurity:   . Worried About Programme researcher, broadcasting/film/video in the Last Year:   . Barista in the Last Year:   Transportation Needs:   . Freight forwarder (Medical):   Marland Kitchen Lack of Transportation (Non-Medical):   Physical Activity:   . Days of Exercise per Week:   . Minutes of Exercise per Session:   Stress:   . Feeling of Stress :   Social Connections:   . Frequency of Communication with Friends and Family:   . Frequency of Social Gatherings with Friends and Family:   . Attends Religious Services:   . Active Member of Clubs or Organizations:   . Attends Banker Meetings:   Marland Kitchen Marital Status:   Intimate Partner Violence:   . Fear of Current or Ex-Partner:   . Emotionally Abused:   Marland Kitchen Physically Abused:   . Sexually Abused:      Physical Exam: BP 102/68 (BP Location: Left Arm, Patient Position: Sitting, Cuff Size: Normal)   Pulse 72   Temp 98.6 F (37 C)   Ht 5' 7.75" (1.721 m) Comment: height measured without shoes  Wt 206 lb (93.4 kg)   BMI 31.55 kg/m  Constitutional: generally well-appearing Psychiatric: alert and oriented x3 Eyes: extraocular movements intact Mouth: oral pharynx moist, no lesions Neck: supple no lymphadenopathy Cardiovascular: heart regular rate and rhythm Lungs: clear to auscultation bilaterally Abdomen: soft, mild right upper quadrant tenderness, nondistended, no obvious ascites, no peritoneal signs, normal bowel sounds Extremities: no lower extremity edema bilaterally Skin: no lesions on visible extremities   Assessment and plan: 39 y.o. female with right upper quadrant pain, nausea, belching  Her pains  certainly seem biliary in origin.  Normal-appearing gallbladder about 2 years ago however I wonder if she has formed obvious stones by now.  I am going to have her repeat right upper quadrant ultrasound and if stones are seen I would send her to [facility] surgery to consider elective cholecystectomy.  If the ultrasound is again normal then the next up evaluation would be upper endoscopy, perhaps she has peptic ulcer disease, H. pylori.  If that is also negative then I would turn attention back to the gallbladder with HIDA scan to estimate for biliary dyskinesia.  Please see the "Patient Instructions" section for addition details about the plan.   Owens Loffler, MD [facility] 11/13/2019, 8:39 AM  Cc: Forrest Moron, MD  Total time on date of encounter was 45  minutes (this included time spent preparing to see the patient reviewing records; obtaining and/or reviewing separately obtained history; performing a medically appropriate exam and/or evaluation; counseling and educating the patient and family if present; ordering medications, tests or procedures if applicable; and documenting clinical information in the health record).

## 2019-11-19 ENCOUNTER — Ambulatory Visit
Admission: RE | Admit: 2019-11-19 | Discharge: 2019-11-19 | Disposition: A | Discharge status: Home / Self Care | Payer: BC Managed Care – PPO | Source: Ambulatory Visit | Attending: Gastroenterology | Admitting: Gastroenterology

## 2019-11-19 DIAGNOSIS — R1011 Right upper quadrant pain: Secondary | ICD-10-CM

## 2019-12-12 ENCOUNTER — Other Ambulatory Visit: Payer: Self-pay

## 2019-12-12 ENCOUNTER — Ambulatory Visit (AMBULATORY_SURGERY_CENTER): Payer: Self-pay | Admitting: *Deleted

## 2019-12-12 VITALS — Temp 97.6°F | Ht 68.0 in | Wt 203.0 lb

## 2019-12-12 DIAGNOSIS — R1011 Right upper quadrant pain: Secondary | ICD-10-CM

## 2019-12-12 NOTE — Progress Notes (Signed)

## 2019-12-13 ENCOUNTER — Encounter: Payer: Self-pay | Admitting: Gastroenterology

## 2019-12-16 ENCOUNTER — Other Ambulatory Visit: Payer: Self-pay

## 2019-12-16 ENCOUNTER — Encounter: Payer: Self-pay | Admitting: Gastroenterology

## 2019-12-16 ENCOUNTER — Ambulatory Visit (AMBULATORY_SURGERY_CENTER): Payer: BC Managed Care – PPO | Admitting: Gastroenterology

## 2019-12-16 VITALS — BP 106/61 | HR 49 | Temp 97.7°F | Resp 15 | Ht 67.75 in | Wt 200.0 lb

## 2019-12-16 DIAGNOSIS — R1011 Right upper quadrant pain: Secondary | ICD-10-CM

## 2019-12-16 DIAGNOSIS — K3189 Other diseases of stomach and duodenum: Secondary | ICD-10-CM

## 2019-12-16 DIAGNOSIS — K2950 Unspecified chronic gastritis without bleeding: Secondary | ICD-10-CM

## 2019-12-16 MED ORDER — SODIUM CHLORIDE 0.9 % IV SOLN: 500.0000 mL | Freq: Once | INTRAVENOUS | Status: DC

## 2019-12-16 NOTE — Progress Notes (Signed)
Vitals-DT Temp-JB  Pt's states no medical or surgical changes since previsit or office visit. 

## 2019-12-16 NOTE — Op Note (Signed)
[facility] Patient Name: Amy Hull Procedure Date: 12/16/2019 1:31 PM MRN: [id number] Endoscopist: Rachael Fee , MD Age: 39 Referring MD:  Date of Birth: 1980-09-14 Gender: Female Account #: [id number] Procedure:                Upper GI endoscopy Indications:              Abdominal pain in the right upper quadrant,                            Dyspepsia Medicines:                Monitored Anesthesia Care Procedure:                Pre-Anesthesia Assessment:                           - Prior to the procedure, a History and Physical                            was performed, and patient medications and                            allergies were reviewed. The patient's tolerance of                            previous anesthesia was also reviewed. The risks                            and benefits of the procedure and the sedation                            options and risks were discussed with the patient.                            All questions were answered, and informed consent                            was obtained. Prior Anticoagulants: The patient has                            taken no previous anticoagulant or antiplatelet                            agents. ASA Grade Assessment: II - A patient with                            mild systemic disease. After reviewing the risks                            and benefits, the patient was deemed in                            satisfactory condition to undergo the procedure.  After obtaining informed consent, the endoscope was                            passed under direct vision. Throughout the                            procedure, the patient's blood pressure, pulse, and                            oxygen saturations were monitored continuously. The                            Endoscope was introduced through the mouth, and                            advanced to the second part of duodenum. The upper                             GI endoscopy was accomplished without difficulty.                            The patient tolerated the procedure well. Scope In: Scope Out: Findings:                 Minimal inflammation characterized by erythema was                            found in the gastric antrum. Biopsies were taken                            with a cold forceps for histology.                           The exam was otherwise without abnormality. Complications:            No immediate complications. Estimated blood loss:                            None. Estimated Blood Loss:     Estimated blood loss: none. Impression:               - Gastritis. Biopsied.                           - The examination was otherwise normal. Recommendation:           - Patient has a contact number available for                            emergencies. The signs and symptoms of potential                            delayed complications were discussed with the                            patient. Return to normal activities tomorrow.  Written discharge instructions were provided to the                            patient.                           - Resume previous diet.                           - Continue present medications.                           - Await pathology results. If the biopsies do not                            show clear etiology of your symptoms then we will                            likely arrange nuclear medicine HIDA scan to check                            for GB dyskinesia. Rachael Fee, MD 12/16/2019 1:41:48 PM This report has been signed electronically.

## 2019-12-16 NOTE — Patient Instructions (Signed)
Impression/Recommendations:  Gastritis handout given to patient.  Resume previous diet. Continue present medications.  Await pathology results.  YOU HAD AN ENDOSCOPIC PROCEDURE TODAY AT THE  ENDOSCOPY CENTER:   Refer to the procedure report that was given to you for any specific questions about what was found during the examination.  If the procedure report does not answer your questions, please call your gastroenterologist to clarify.  If you requested that your care partner not be given the details of your procedure findings, then the procedure report has been included in a sealed envelope for you to review at your convenience later.  YOU SHOULD EXPECT: Some feelings of bloating in the abdomen. Passage of more gas than usual.  Walking can help get rid of the air that was put into your GI tract during the procedure and reduce the bloating. If you had a lower endoscopy (such as a colonoscopy or flexible sigmoidoscopy) you may notice spotting of blood in your stool or on the toilet paper. If you underwent a bowel prep for your procedure, you may not have a normal bowel movement for a few days.  Please Note:  You might notice some irritation and congestion in your nose or some drainage.  This is from the oxygen used during your procedure.  There is no need for concern and it should clear up in a day or so.  SYMPTOMS TO REPORT IMMEDIATELY:  Following upper endoscopy (EGD)  Vomiting of blood or coffee ground material  New chest pain or pain under the shoulder blades  Painful or persistently difficult swallowing  New shortness of breath  Fever of 100F or higher  Black, tarry-looking stools  For urgent or emergent issues, a gastroenterologist can be reached at any hour by calling [phone]. Do not use MyChart messaging for urgent concerns.    DIET:  We do recommend a small meal at first, but then you may proceed to your regular diet.  Drink plenty of fluids but you should avoid  alcoholic beverages for 24 hours.  ACTIVITY:  You should plan to take it easy for the rest of today and you should NOT DRIVE or use heavy machinery until tomorrow (because of the sedation medicines used during the test).    FOLLOW UP: Our staff will call the number listed on your records 48-72 hours following your procedure to check on you and address any questions or concerns that you may have regarding the information given to you following your procedure. If we do not reach you, we will leave a message.  We will attempt to reach you two times.  During this call, we will ask if you have developed any symptoms of COVID 19. If you develop any symptoms (ie: fever, flu-like symptoms, shortness of breath, cough etc.) before then, please call [phone].  If you test positive for Covid 19 in the 2 weeks post procedure, please call and report this information to [place].    If any biopsies were taken you will be contacted by phone or by letter within the next 1-3 weeks.  Please call us at [phone] if you have not heard about the biopsies in 3 weeks.    SIGNATURES/CONFIDENTIALITY: You and/or your care partner have signed paperwork which will be entered into your electronic medical record.  These signatures attest to the fact that that the information above on your After Visit Summary has been reviewed and is understood.  Full responsibility of the confidentiality of this discharge information lies with  you and/or your care-partner.

## 2019-12-16 NOTE — Progress Notes (Signed)
Called to room to assist during endoscopic procedure.  Patient ID and intended procedure confirmed with present staff. Received instructions for my participation in the procedure from the performing physician.  

## 2019-12-18 ENCOUNTER — Telehealth: Payer: Self-pay

## 2019-12-18 NOTE — Telephone Encounter (Signed)
  Follow up Call-  Call back number 12/16/2019  Post procedure Call Back phone  # [phone]  Permission to leave phone message Yes  Some recent data might be hidden     Left message

## 2019-12-18 NOTE — Telephone Encounter (Signed)
Attempted to reach patient for post-procedure f/u call. No answer. Left message for her to please not hesitate to call us if she has any questions/concerns regarding her care. 

## 2019-12-20 ENCOUNTER — Other Ambulatory Visit: Payer: Self-pay

## 2019-12-20 DIAGNOSIS — R1011 Right upper quadrant pain: Secondary | ICD-10-CM

## 2019-12-20 DIAGNOSIS — K828 Other specified diseases of gallbladder: Secondary | ICD-10-CM

## 2020-01-02 ENCOUNTER — Other Ambulatory Visit: Payer: Self-pay

## 2020-01-02 ENCOUNTER — Ambulatory Visit (HOSPITAL_COMMUNITY)
Admission: RE | Admit: 2020-01-02 | Discharge: 2020-01-02 | Disposition: A | Discharge status: Home / Self Care | Payer: BC Managed Care – PPO | Source: Ambulatory Visit | Attending: Gastroenterology | Admitting: Gastroenterology

## 2020-01-02 DIAGNOSIS — R1011 Right upper quadrant pain: Secondary | ICD-10-CM

## 2020-01-02 DIAGNOSIS — K828 Other specified diseases of gallbladder: Secondary | ICD-10-CM | POA: Insufficient documentation

## 2020-01-02 MED ORDER — TECHNETIUM TC 99M MEBROFENIN IV KIT
5.2000 | PACK | Freq: Once | INTRAVENOUS | Status: AC | PRN
  Administered 2020-01-02: 5.2 via INTRAVENOUS

## 2020-01-07 ENCOUNTER — Other Ambulatory Visit: Payer: Self-pay | Admitting: Family Medicine

## 2020-01-07 NOTE — Telephone Encounter (Signed)
Requested Prescriptions  Pending Prescriptions Disp Refills  . esomeprazole (NEXIUM) 40 MG capsule [Pharmacy Med Name: ESOMEPRAZOLE MAG DR 40 MG CAP] 90 capsule 1    Sig: TAKE 1 CAPSULE BY MOUTH EVERY DAY     Gastroenterology: Proton Pump Inhibitors Passed - 01/07/2020  2:05 AM      Passed - Valid encounter within last 12 months    Recent Outpatient Visits          2 months ago Abdominal pain, epigastric   Primary Care at [facility], Manus Rudd, MD   1 year ago Abdominal pain, epigastric   Primary Care at [facility], Sandria Bales, MD   1 year ago Cough   Primary Care at [facility], Manus Rudd, MD   2 years ago Suppurative hidradenitis   Primary Care at [facility], Manus Rudd, MD   2 years ago Puncture wound of right foot, initial encounter   Primary Care at [place], Woodlynne D, [place]

## 2020-01-24 ENCOUNTER — Encounter: Payer: Self-pay | Admitting: Family Medicine

## 2020-01-24 ENCOUNTER — Other Ambulatory Visit: Payer: Self-pay

## 2020-01-24 ENCOUNTER — Ambulatory Visit: Payer: BC Managed Care – PPO | Admitting: Family Medicine

## 2020-01-24 VITALS — BP 119/77 | HR 72 | Temp 98.3°F | Ht 67.0 in | Wt 207.4 lb

## 2020-01-24 DIAGNOSIS — H9312 Tinnitus, left ear: Secondary | ICD-10-CM

## 2020-01-24 DIAGNOSIS — R42 Dizziness and giddiness: Secondary | ICD-10-CM

## 2020-01-24 DIAGNOSIS — H833X2 Noise effects on left inner ear: Secondary | ICD-10-CM

## 2020-01-24 MED ORDER — MECLIZINE HCL 12.5 MG PO TABS: ORAL_TABLET | ORAL | 1 refills | Status: AC

## 2020-01-24 NOTE — Progress Notes (Signed)
Patient ID: Amy Hull, female    DOB: Dec 18, 1980  Age: 39 y.o. MRN: [id number]  Chief Complaint  Patient presents with  . Tinnitus    Pt stated that she has been experiencing some ringing in her Lt ear for the past 2 weeks. At times, she noticed that she will get dizzy and when she lays dw, it will seem like the rm is spinning.    Subjective:  39 year old lady who comes in with history of having tinnitus recently in her left ear.  It started after she had been at the gym and using loud sound through ear buds.  She also has had some dizziness yesterday when at a meeting.  The tinnitus comes and goes.  At least she is aware of it at times and not at other times.  No pain.  Does not have any cold or other symptoms no popping of the ear sensation.  Current allergies, medications, problem list, past/family and social histories reviewed.  Objective:  BP 119/77 (BP Location: Right Arm, Patient Position: Sitting, Cuff Size: Large)   Pulse 72   Temp 98.3 F (36.8 C) (Temporal)   Ht 5\' 7"  (1.702 m)   Wt 207 lb 6.4 oz (94.1 kg)   SpO2 94%   BMI 32.48 kg/m   No acute distress.  TMs normal.  No TMJ tenderness.  Neck supple without significant nodes.  Throat clear.  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  Assessment & Plan:   Assessment: 1. Tinnitus aurium, left   2. Dizziness   3. 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  Referring to audiology.  May end up having to see an ENT, but I think cardiology is the first thing for her.  Orders Placed This Encounter  Procedures  . Ambulatory referral to Audiology    Referral Priority:   Routine    Referral Type:   Audiology Exam    Referral Reason:   Specialty Services Required    Number of Visits Requested:   1    Meds ordered this encounter  Medications  . meclizine (ANTIVERT) 12.5 MG tablet    Sig: Take 1 or 2 pills every 6 hours if needed for dizziness    Dispense:   30 tablet    Refill:  1         Patient Instructions   Use Flonase 2 sprays each nostril each evening to help eustachian tubes.  Referral is being made to audiology.  Should you not hear from this by mid-to-late week call back and speak to the referrals desk.  Take meclizine 12.5 to 25 mg every 6 or 8 hours only if needed for dizziness  Do not drive if dizzy  In the event of acute worsening go to the emergency room.  Avoid excessive sound exposure Tinnitus Tinnitus refers to hearing a sound when there is no actual source for that sound. This is often described as ringing in the ears. However, people with this condition may hear a variety of noises, in one ear or in both ears. The sounds of tinnitus can be soft, loud, or somewhere in between. Tinnitus can last for a few seconds or can be constant for days. It may go away without treatment and come back at various times. When tinnitus is constant or happens often, it can lead to other problems, such as trouble sleeping and trouble concentrating. Almost everyone experiences tinnitus at  some point. Tinnitus that is long-lasting (chronic) or comes back often (recurs) may require medical attention. What are the causes? The cause of tinnitus is often not known. In some cases, it can result from:  Exposure to loud noises from machinery, music, or other sources.  An object (foreign body) stuck in the ear.  Earwax buildup.  Drinking alcohol or caffeine.  Taking certain medicines.  Age-related hearing loss. It may also be caused by medical conditions such as:  Ear or sinus infections.  High blood pressure.  Heart diseases.  Anemia.  Allergies.  Meniere's disease.  Thyroid problems.  Tumors.  A weak, bulging blood vessel (aneurysm) near the ear. What are the signs or symptoms? The main symptom of tinnitus is hearing a sound when there is no source for that sound. It may sound  like:  Buzzing.  Roaring.  Ringing.  Blowing air.  Hissing.  Whistling.  Sizzling.  Humming.  Running water.  A musical note.  Tapping. Symptoms may affect only one ear (unilateral) or both ears (bilateral). How is this diagnosed? Tinnitus is diagnosed based on your symptoms, your medical history, and a physical exam. Your health care provider may do a thorough hearing test (audiologic exam) if your tinnitus:  Is unilateral.  Causes hearing difficulties.  Lasts 6 months or longer. You may work with a health care provider who specializes in hearing disorders (audiologist). You may be asked questions about your symptoms and how they affect your daily life. You may have other tests done, such as:  CT scan.  MRI.  An imaging test of how blood flows through your blood vessels (angiogram). How is this treated? Treating an underlying medical condition can sometimes make tinnitus go away. If your tinnitus continues, other treatments may include:  Medicines.  Therapy and counseling to help you manage the stress of living with tinnitus.  Sound generators to mask the tinnitus. These include: ? Tabletop sound machines that play relaxing sounds to help you fall asleep. ? Wearable devices that fit in your ear and play sounds or music. ? Acoustic neural stimulation. This involves using headphones to listen to music that contains an auditory signal. Over time, listening to this signal may change some pathways in your brain and make you less sensitive to tinnitus. This treatment is used for very severe cases when no other treatment is working.  Using hearing aids or cochlear implants if your tinnitus is related to hearing loss. Hearing aids are worn in the outer ear. Cochlear implants are surgically placed in the inner ear. Follow these instructions at home: Managing symptoms      When possible, avoid being in loud places and being exposed to loud sounds.  Wear hearing  protection, such as earplugs, when you are exposed to loud noises.  Use a white noise machine, a humidifier, or other devices to mask the sound of tinnitus.  Practice techniques for reducing stress, such as meditation, yoga, or deep breathing. Work with your health care provider if you need help with managing stress.  Sleep with your head slightly raised. This may reduce the impact of tinnitus. General instructions  Do not use stimulants, such as nicotine, alcohol, or caffeine. Talk with your health care provider about other stimulants to avoid. Stimulants are substances that can make you feel alert and attentive by increasing certain activities in the body (such as heart rate and blood pressure). These substances may make tinnitus worse.  Take over-the-counter and prescription medicines only as told by  your health care provider.  Try to get plenty of sleep each night.  Keep all follow-up visits as told by your health care provider. This is important. Contact a health care provider if:  Your tinnitus continues for 3 weeks or longer without stopping.  You develop sudden hearing loss.  Your symptoms get worse or do not get better with home care.  You feel you are not able to manage the stress of living with tinnitus. Get help right away if:  You develop tinnitus after a head injury.  You have tinnitus along with any of the following: ? Dizziness. ? Loss of balance. ? Nausea and vomiting. ? Sudden, severe headache. These symptoms may represent a serious problem that is an emergency. Do not wait to see if the symptoms will go away. Get medical help right away. Call your local emergency services (911 in the [place] Do not drive yourself to the hospital. Summary  Tinnitus refers to hearing a sound when there is no actual source for that sound. This is often described as ringing in the ears.  Symptoms may affect only one ear (unilateral) or both ears (bilateral).  Use a white noise  machine, a humidifier, or other devices to mask the sound of tinnitus.  Do not use stimulants, such as nicotine, alcohol, or caffeine. Talk with your health care provider about other stimulants to avoid. These substances may make tinnitus worse. This information is not intended to replace advice given to you by your health care provider. Make sure you discuss any questions you have with your health care provider. Document Revised: 02/27/2019 Document Reviewed: 05/25/2017 Elsevier Patient Education  [employer].     If you have lab work done today you will be contacted with your lab results within the next 2 weeks.  If you have not heard from [place] then please contact us. The fastest way to get your results is to register for My Chart.   IF you received an x-ray today, you will receive an invoice from [facility]. Please contact [facility] at [phone] with questions or concerns regarding your invoice.   IF you received labwork today, you will receive an invoice from [facility]. Please contact [facility] at [phone] with questions or concerns regarding your invoice.   Our billing staff will not be able to assist you with questions regarding bills from these companies.  You will be contacted with the lab results as soon as they are available. The fastest way to get your results is to activate your My Chart account. Instructions are located on the last page of this paperwork. If you have not heard from [place] regarding the results in 2 weeks, please contact this office.        Return if symptoms worsen or fail to improve.   Ruben Reason, MD 01/24/2020

## 2020-01-24 NOTE — Patient Instructions (Addendum)
Use Flonase 2 sprays each nostril each evening to help eustachian tubes.  Referral is being made to audiology.  Should you not hear from this by mid-to-late week call back and speak to the referrals desk.  Take meclizine 12.5 to 25 mg every 6 or 8 hours only if needed for dizziness  Do not drive if dizzy  In the event of acute worsening go to the emergency room.  Avoid excessive sound exposure Tinnitus Tinnitus refers to hearing a sound when there is no actual source for that sound. This is often described as ringing in the ears. However, people with this condition may hear a variety of noises, in one ear or in both ears. The sounds of tinnitus can be soft, loud, or somewhere in between. Tinnitus can last for a few seconds or can be constant for days. It may go away without treatment and come back at various times. When tinnitus is constant or happens often, it can lead to other problems, such as trouble sleeping and trouble concentrating. Almost everyone experiences tinnitus at some point. Tinnitus that is long-lasting (chronic) or comes back often (recurs) may require medical attention. What are the causes? The cause of tinnitus is often not known. In some cases, it can result from:  Exposure to loud noises from machinery, music, or other sources.  An object (foreign body) stuck in the ear.  Earwax buildup.  Drinking alcohol or caffeine.  Taking certain medicines.  Age-related hearing loss. It may also be caused by medical conditions such as:  Ear or sinus infections.  High blood pressure.  Heart diseases.  Anemia.  Allergies.  Meniere's disease.  Thyroid problems.  Tumors.  A weak, bulging blood vessel (aneurysm) near the ear. What are the signs or symptoms? The main symptom of tinnitus is hearing a sound when there is no source for that sound. It may sound like:  Buzzing.  Roaring.  Ringing.  Blowing  air.  Hissing.  Whistling.  Sizzling.  Humming.  Running water.  A musical note.  Tapping. Symptoms may affect only one ear (unilateral) or both ears (bilateral). How is this diagnosed? Tinnitus is diagnosed based on your symptoms, your medical history, and a physical exam. Your health care provider may do a thorough hearing test (audiologic exam) if your tinnitus:  Is unilateral.  Causes hearing difficulties.  Lasts 6 months or longer. You may work with a health care provider who specializes in hearing disorders (audiologist). You may be asked questions about your symptoms and how they affect your daily life. You may have other tests done, such as:  CT scan.  MRI.  An imaging test of how blood flows through your blood vessels (angiogram). How is this treated? Treating an underlying medical condition can sometimes make tinnitus go away. If your tinnitus continues, other treatments may include:  Medicines.  Therapy and counseling to help you manage the stress of living with tinnitus.  Sound generators to mask the tinnitus. These include: ? Tabletop sound machines that play relaxing sounds to help you fall asleep. ? Wearable devices that fit in your ear and play sounds or music. ? Acoustic neural stimulation. This involves using headphones to listen to music that contains an auditory signal. Over time, listening to this signal may change some pathways in your brain and make you less sensitive to tinnitus. This treatment is used for very severe cases when no other treatment is working.  Using hearing aids or cochlear implants if your tinnitus is related  to hearing loss. Hearing aids are worn in the outer ear. Cochlear implants are surgically placed in the inner ear. Follow these instructions at home: Managing symptoms      When possible, avoid being in loud places and being exposed to loud sounds.  Wear hearing protection, such as earplugs, when you are exposed to  loud noises.  Use a white noise machine, a humidifier, or other devices to mask the sound of tinnitus.  Practice techniques for reducing stress, such as meditation, yoga, or deep breathing. Work with your health care provider if you need help with managing stress.  Sleep with your head slightly raised. This may reduce the impact of tinnitus. General instructions  Do not use stimulants, such as nicotine, alcohol, or caffeine. Talk with your health care provider about other stimulants to avoid. Stimulants are substances that can make you feel alert and attentive by increasing certain activities in the body (such as heart rate and blood pressure). These substances may make tinnitus worse.  Take over-the-counter and prescription medicines only as told by your health care provider.  Try to get plenty of sleep each night.  Keep all follow-up visits as told by your health care provider. This is important. Contact a health care provider if:  Your tinnitus continues for 3 weeks or longer without stopping.  You develop sudden hearing loss.  Your symptoms get worse or do not get better with home care.  You feel you are not able to manage the stress of living with tinnitus. Get help right away if:  You develop tinnitus after a head injury.  You have tinnitus along with any of the following: ? Dizziness. ? Loss of balance. ? Nausea and vomiting. ? Sudden, severe headache. These symptoms may represent a serious problem that is an emergency. Do not wait to see if the symptoms will go away. Get medical help right away. Call your local emergency services (911 in the [place] Do not drive yourself to the hospital. Summary  Tinnitus refers to hearing a sound when there is no actual source for that sound. This is often described as ringing in the ears.  Symptoms may affect only one ear (unilateral) or both ears (bilateral).  Use a white noise machine, a humidifier, or other devices to mask the  sound of tinnitus.  Do not use stimulants, such as nicotine, alcohol, or caffeine. Talk with your health care provider about other stimulants to avoid. These substances may make tinnitus worse. This information is not intended to replace advice given to you by your health care provider. Make sure you discuss any questions you have with your health care provider. Document Revised: 02/27/2019 Document Reviewed: 05/25/2017 Elsevier Patient Education  [employer].     If you have lab work done today you will be contacted with your lab results within the next 2 weeks.  If you have not heard from [place] then please contact us. The fastest way to get your results is to register for My Chart.   IF you received an x-ray today, you will receive an invoice from [facility]. Please contact [facility] at [phone] with questions or concerns regarding your invoice.   IF you received labwork today, you will receive an invoice from [facility]. Please contact [facility] at [phone] with questions or concerns regarding your invoice.   Our billing staff will not be able to assist you with questions regarding bills from these companies.  You will be contacted with the lab results as  soon as they are available. The fastest way to get your results is to activate your My Chart account. Instructions are located on the last page of this paperwork. If you have not heard from [place] regarding the results in 2 weeks, please contact this office.

## 2020-02-07 ENCOUNTER — Encounter (INDEPENDENT_AMBULATORY_CARE_PROVIDER_SITE_OTHER): Payer: Self-pay | Admitting: Otolaryngology

## 2020-02-07 ENCOUNTER — Other Ambulatory Visit: Payer: Self-pay

## 2020-02-07 ENCOUNTER — Ambulatory Visit (INDEPENDENT_AMBULATORY_CARE_PROVIDER_SITE_OTHER): Payer: BC Managed Care – PPO | Admitting: Otolaryngology

## 2020-02-07 VITALS — Temp 97.9°F

## 2020-02-07 DIAGNOSIS — H9042 Sensorineural hearing loss, unilateral, left ear, with unrestricted hearing on the contralateral side: Secondary | ICD-10-CM

## 2020-02-07 DIAGNOSIS — H9120 Sudden idiopathic hearing loss, unspecified ear: Secondary | ICD-10-CM

## 2020-02-07 DIAGNOSIS — H9312 Tinnitus, left ear: Secondary | ICD-10-CM | POA: Diagnosis not present

## 2020-02-07 NOTE — Progress Notes (Signed)
HPI: Amy Hull is a 39 y.o. female who presents is referred by by her PCP for evaluation of dizziness and tinnitus in the left ear over the past month.  Symptoms apparently began back toward the end of April with a constant whooshing sound in the left ear that is consistent with tinnitus.  About 2 weeks ago while on a meeting at home she developed dizziness with sensation of vertigo that lasted for about 10 to 15 minutes.  But then a day later she had similar symptoms that lasted for several hours.  She has had no more vertigo or dizzy symptoms although she has some slight dizziness and has taken meclizine occasionally.  She feels like the left ear feels heavy in aches some and has some decreased hearing with tinnitus..  Past Medical History:  Diagnosis Date  . Acne    Accutane 2013  . Allergy    seasonal   . Anxiety   . Asthma    exercise induced  . 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 Herpes labialis   . HLD (hyperlipidemia)   . Lactose intolerance    Past Surgical History:  Procedure Laterality Date  . CYST REMOVAL TRUNK  10/2012   mid breast area  . Dental implants  2009  . Mirena   10/07 and 10/12/10   Social History   Socioeconomic History  . Marital status: Single    Spouse name: Not on file  . Number of children: 0  . Years of education: Not on file  . Highest education level: Not on file  Occupational History  . Occupation: Tree surgeon  Tobacco Use  . Smoking status: Never Smoker  . Smokeless tobacco: Never Used  Vaping Use  . Vaping Use: Never used  Substance and Sexual Activity  . Alcohol use: Yes    Alcohol/week: 1.0 standard drink    Types: 1 Glasses of wine per week    Comment: 1-2 drinks a week, mostly weekends  . Drug use: No  . Sexual activity: Yes    Partners: Male    Birth control/protection: I.U.D.    Comment: 1 sexual partner in last 12 months  Other Topics Concern  .  Not on file  Social History Narrative   Single.   Social Determinants of Health   Financial Resource Strain:   . Difficulty of Paying Living Expenses:   Food Insecurity:   . Worried About Programme researcher, broadcasting/film/video in the Last Year:   . Barista in the Last Year:   Transportation Needs:   . Freight forwarder (Medical):   Marland Kitchen Lack of Transportation (Non-Medical):   Physical Activity:   . Days of Exercise per Week:   . Minutes of Exercise per Session:   Stress:   . Feeling of Stress :   Social Connections:   . Frequency of Communication with Friends and Family:   . Frequency of Social Gatherings with Friends and Family:   . Attends Religious Services:   . Active Member of Clubs or Organizations:   . Attends Banker Meetings:   Marland Kitchen Marital Status:    Family History  Problem Relation Age of Onset  . Stroke Father   . Hypertension Father   . Hyperlipidemia Father   . Colon polyps Father   . Thyroid disease Sister   . Ovarian cysts Sister   . Hyperlipidemia Sister   . Lung cancer Maternal  Grandfather   . Other Paternal Grandmother        hip replacemnt  . Alzheimer's disease Paternal Grandfather   . Colon cancer Neg Hx   . Esophageal cancer Neg Hx   . Rectal cancer Neg Hx   . Stomach cancer Neg Hx    Allergies  Allergen Reactions  . Gramineae Pollens   . Pollen Extract    Prior to Admission medications   Medication Sig Start Date End Date Taking? Authorizing Provider  albuterol (PROVENTIL HFA;VENTOLIN HFA) 108 (90 Base) MCG/ACT inhaler TAKE 2 PUFFS BY MOUTH EVERY 6 HOURS AS NEEDED FOR WHEEZE OR SHORTNESS OF BREATH 05/02/18  Yes Stallings, Zoe A, MD  esomeprazole (NEXIUM) 40 MG capsule TAKE 1 CAPSULE BY MOUTH EVERY DAY 01/07/20  Yes Forrest Moron, MD  levonorgestrel (MIRENA) 20 MCG/24HR IUD 1 each by Intrauterine route once.   Yes [provider]  meclizine (ANTIVERT) 12.5 MG tablet Take 1 or 2 pills every 6 hours if needed for dizziness 01/24/20   Yes Posey Boyer, MD  mometasone (NASONEX) 50 MCG/ACT nasal spray Place 2 sprays into the nose daily. 03/07/18  Yes Forrest Moron, MD  Multiple Vitamin (MULTIVITAMIN) capsule Take 1 capsule by mouth daily.   Yes [provider]  sucralfate (CARAFATE) 1 g tablet Take 1 tablet (1 g total) by mouth 4 (four) times daily -  with meals and at bedtime. 10/16/19  Yes Stallings, Zoe A, MD  ondansetron (ZOFRAN ODT) 4 MG disintegrating tablet Dissolve 1 under tongue every 6 or 8 hours as needed for nausea or vomiting. 08/27/18   Posey Boyer, MD     Positive ROS: Otherwise negative  All other systems have been reviewed and were otherwise negative with the exception of those mentioned in the HPI and as above.  Physical Exam: Constitutional: Alert, well-appearing, no acute distress.  No vertigo or nystagmus in the office today. Ears: External ears without lesions or tenderness. Ear canals are clear bilaterally with intact, clear TMs bilaterally with good mobility pneumatic otoscopy.  On hearing screening with a tuning forks she has diminished hearing in the left ear.  On Dix-Hallpike testing she had no evidence of BPPV.Marland Kitchen  Nasal: External nose without lesions. Septum midline with mild rhinitis.. Clear nasal passages with no signs of infection. Oral: Lips and gums without lesions. Tongue and palate mucosa without lesions. Posterior oropharynx clear. Neck: No palpable adenopathy or masses Respiratory: Breathing comfortably  Skin: No facial/neck lesions or rash noted.  Audiogram in the office today demonstrated normal hearing in the right ear and a moderate low-frequency sensorineural hearing loss in the left ear.  Hearing goes down to 50 dB at 500 frequency and then rises to approximately 20 db at 2000 and 4000 frequency.  Procedures  Assessment: Acute inner ear abnormality.  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  Plan: Reviewed the audiogram with the patient in the office today.  We will  treat her with a 2-week course of steroids starting with prednisone 60 mg for 3 days and then tapering over the next 2 weeks.  In addition placed on amoxicillin 875 mg twice daily for 10 days.  She will follow-up in 2-1/2 weeks for recheck and repeat audiologic testing.   Radene Journey, MD   CC:

## 2020-02-10 ENCOUNTER — Encounter (INDEPENDENT_AMBULATORY_CARE_PROVIDER_SITE_OTHER): Payer: Self-pay

## 2020-02-27 ENCOUNTER — Ambulatory Visit (INDEPENDENT_AMBULATORY_CARE_PROVIDER_SITE_OTHER): Payer: BC Managed Care – PPO | Admitting: Otolaryngology

## 2020-02-27 ENCOUNTER — Encounter (INDEPENDENT_AMBULATORY_CARE_PROVIDER_SITE_OTHER): Payer: Self-pay | Admitting: Otolaryngology

## 2020-02-27 ENCOUNTER — Other Ambulatory Visit: Payer: Self-pay

## 2020-02-27 VITALS — Temp 97.5°F

## 2020-02-27 DIAGNOSIS — H9042 Sensorineural hearing loss, unilateral, left ear, with unrestricted hearing on the contralateral side: Secondary | ICD-10-CM | POA: Diagnosis not present

## 2020-02-27 NOTE — Progress Notes (Signed)
HPI: Amy Hull is a 39 y.o. female who returns today for evaluation of left ear hearing loss.  She is just completed a 2-week course of steroids and feels like her hearing is better.  She underwent repeat audiologic testing today which demonstrated just mild improvement in the mid frequencies but no improvement in the lower frequencies.  She had type a tympanogram.  She has had no further vertigo or dizzy episodes but still has a little imbalance.  Also complains of some fullness behind the left ear..  Past Medical History:  Diagnosis Date  . Acne    Accutane 2013  . Allergy    seasonal   . Anxiety   . Asthma    exercise induced  . 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 Herpes labialis   . HLD (hyperlipidemia)   . Lactose intolerance    Past Surgical History:  Procedure Laterality Date  . CYST REMOVAL TRUNK  10/2012   mid breast area  . Dental implants  2009  . Mirena   10/07 and 10/12/10   Social History   Socioeconomic History  . Marital status: Single    Spouse name: Not on file  . Number of children: 0  . Years of education: Not on file  . Highest education level: Not on file  Occupational History  . Occupation: Tree surgeon  Tobacco Use  . Smoking status: Never Smoker  . Smokeless tobacco: Never Used  Vaping Use  . Vaping Use: Never used  Substance and Sexual Activity  . Alcohol use: Yes    Alcohol/week: 1.0 standard drink    Types: 1 Glasses of wine per week    Comment: 1-2 drinks a week, mostly weekends  . Drug use: No  . Sexual activity: Yes    Partners: Male    Birth control/protection: I.U.D.    Comment: 1 sexual partner in last 12 months  Other Topics Concern  . Not on file  Social History Narrative   Single.   Social Determinants of Health   Financial Resource Strain:   . Difficulty of Paying Living Expenses:   Food Insecurity:   . Worried About Programme researcher, broadcasting/film/video in  the Last Year:   . Barista in the Last Year:   Transportation Needs:   . Freight forwarder (Medical):   Marland Kitchen Lack of Transportation (Non-Medical):   Physical Activity:   . Days of Exercise per Week:   . Minutes of Exercise per Session:   Stress:   . Feeling of Stress :   Social Connections:   . Frequency of Communication with Friends and Family:   . Frequency of Social Gatherings with Friends and Family:   . Attends Religious Services:   . Active Member of Clubs or Organizations:   . Attends Banker Meetings:   Marland Kitchen Marital Status:    Family History  Problem Relation Age of Onset  . Stroke Father   . Hypertension Father   . Hyperlipidemia Father   . Colon polyps Father   . Thyroid disease Sister   . Ovarian cysts Sister   . Hyperlipidemia Sister   . Lung cancer Maternal Grandfather   . Other Paternal Grandmother        hip replacemnt  . Alzheimer's disease Paternal Grandfather   . Colon cancer Neg Hx   . Esophageal cancer Neg Hx   . Rectal cancer Neg Hx   .  Stomach cancer Neg Hx    Allergies  Allergen Reactions  . Gramineae Pollens   . Pollen Extract    Prior to Admission medications   Medication Sig Start Date End Date Taking? Authorizing Provider  albuterol (PROVENTIL HFA;VENTOLIN HFA) 108 (90 Base) MCG/ACT inhaler TAKE 2 PUFFS BY MOUTH EVERY 6 HOURS AS NEEDED FOR WHEEZE OR SHORTNESS OF BREATH 05/02/18  Yes Stallings, Zoe A, MD  esomeprazole (NEXIUM) 40 MG capsule TAKE 1 CAPSULE BY MOUTH EVERY DAY 01/07/20  Yes Doristine Bosworth, MD  levonorgestrel (MIRENA) 20 MCG/24HR IUD 1 each by Intrauterine route once.   Yes [provider]  meclizine (ANTIVERT) 12.5 MG tablet Take 1 or 2 pills every 6 hours if needed for dizziness 01/24/20  Yes Peyton Najjar, MD  mometasone (NASONEX) 50 MCG/ACT nasal spray Place 2 sprays into the nose daily. 03/07/18  Yes Doristine Bosworth, MD  Multiple Vitamin (MULTIVITAMIN) capsule Take 1 capsule by mouth daily.   Yes  [provider]  sucralfate (CARAFATE) 1 g tablet Take 1 tablet (1 g total) by mouth 4 (four) times daily -  with meals and at bedtime. 10/16/19  Yes Stallings, Zoe A, MD  ondansetron (ZOFRAN ODT) 4 MG disintegrating tablet Dissolve 1 under tongue every 6 or 8 hours as needed for nausea or vomiting. 08/27/18   Peyton Najjar, MD     Positive ROS: Otherwise negative  All other systems have been reviewed and were otherwise negative with the exception of those mentioned in the HPI and as above.  Physical Exam: Constitutional: Alert, well-appearing, no acute distress Ears: External ears without lesions or tenderness. Ear canals are clear bilaterally with intact, clear TMs bilaterally. Nasal: External nose without lesions. Clear nasal passages Oral: Lips and gums without lesions. Tongue and palate mucosa without lesions. Posterior oropharynx clear. Neck: No palpable adenopathy or masses Respiratory: Breathing comfortably  Skin: No facial/neck lesions or rash noted.  Procedures  Assessment: Left ear low-frequency sensorineural hearing loss.  Question whether this could represent Mnire's disease versus cochlear or retrocochlear pathology.  Plan: We will plan on scheduling MRI scan to rule out cochlear or retrocochlear pathology.  Patient will call us following the MRI scan concerning results. She will also notify us if she has any further episodes of vertigo. Suggested low-salt diet but did not start diuretic.   Narda Bonds, MD

## 2020-02-28 ENCOUNTER — Other Ambulatory Visit (INDEPENDENT_AMBULATORY_CARE_PROVIDER_SITE_OTHER): Payer: Self-pay

## 2020-02-28 DIAGNOSIS — H9042 Sensorineural hearing loss, unilateral, left ear, with unrestricted hearing on the contralateral side: Secondary | ICD-10-CM

## 2020-03-02 ENCOUNTER — Encounter (INDEPENDENT_AMBULATORY_CARE_PROVIDER_SITE_OTHER): Payer: Self-pay

## 2020-03-18 ENCOUNTER — Other Ambulatory Visit (INDEPENDENT_AMBULATORY_CARE_PROVIDER_SITE_OTHER): Payer: Self-pay | Admitting: Otolaryngology

## 2020-03-29 ENCOUNTER — Ambulatory Visit
Admission: RE | Admit: 2020-03-29 | Discharge: 2020-03-29 | Disposition: A | Discharge status: Home / Self Care | Payer: BC Managed Care – PPO | Source: Ambulatory Visit | Attending: Otolaryngology | Admitting: Otolaryngology

## 2020-03-29 DIAGNOSIS — H9042 Sensorineural hearing loss, unilateral, left ear, with unrestricted hearing on the contralateral side: Secondary | ICD-10-CM

## 2020-03-29 MED ORDER — GADOBENATE DIMEGLUMINE 529 MG/ML IV SOLN
20.0000 mL | Freq: Once | INTRAVENOUS | Status: AC | PRN
  Administered 2020-03-29: 20 mL via INTRAVENOUS

## 2020-03-31 ENCOUNTER — Ambulatory Visit (INDEPENDENT_AMBULATORY_CARE_PROVIDER_SITE_OTHER): Payer: BC Managed Care – PPO | Admitting: Otolaryngology

## 2020-03-31 ENCOUNTER — Other Ambulatory Visit: Payer: Self-pay

## 2020-03-31 ENCOUNTER — Encounter (INDEPENDENT_AMBULATORY_CARE_PROVIDER_SITE_OTHER): Payer: Self-pay | Admitting: Otolaryngology

## 2020-03-31 VITALS — Temp 97.5°F

## 2020-03-31 DIAGNOSIS — H8102 Meniere's disease, left ear: Secondary | ICD-10-CM

## 2020-03-31 NOTE — Progress Notes (Signed)
HPI: Amy Hull is a 39 y.o. female who returns today for evaluation of left ear low-frequency sensorineural hearing loss.  She probably has Mnire's disease.  She recently underwent an MRI scan that showed no cochlear or retrocochlear pathology..  She did well with her symptoms in June and the first part of July but over the past couple weeks has had more pressure in the left ear as well as ringing in the left ear and some dizziness.  She tried the meclizine but this seemed to cause her a headache.  She has not noted any change in her hearing. She has not started a diuretic yet  Past Medical History:  Diagnosis Date  . Acne    Accutane 2013  . Allergy    seasonal   . Anxiety   . Asthma    exercise induced  . 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 Herpes labialis   . HLD (hyperlipidemia)   . Lactose intolerance    Past Surgical History:  Procedure Laterality Date  . CYST REMOVAL TRUNK  10/2012   mid breast area  . Dental implants  2009  . Mirena   10/07 and 10/12/10   Social History   Socioeconomic History  . Marital status: Single    Spouse name: Not on file  . Number of children: 0  . Years of education: Not on file  . Highest education level: Not on file  Occupational History  . Occupation: Tree surgeon  Tobacco Use  . Smoking status: Never Smoker  . Smokeless tobacco: Never Used  Vaping Use  . Vaping Use: Never used  Substance and Sexual Activity  . Alcohol use: Yes    Alcohol/week: 1.0 standard drink    Types: 1 Glasses of wine per week    Comment: 1-2 drinks a week, mostly weekends  . Drug use: No  . Sexual activity: Yes    Partners: Male    Birth control/protection: I.U.D.    Comment: 1 sexual partner in last 12 months  Other Topics Concern  . Not on file  Social History Narrative   Single.   Social Determinants of Health   Financial Resource Strain:   . Difficulty of Paying  Living Expenses:   Food Insecurity:   . Worried About Programme researcher, broadcasting/film/video in the Last Year:   . Barista in the Last Year:   Transportation Needs:   . Freight forwarder (Medical):   Marland Kitchen Lack of Transportation (Non-Medical):   Physical Activity:   . Days of Exercise per Week:   . Minutes of Exercise per Session:   Stress:   . Feeling of Stress :   Social Connections:   . Frequency of Communication with Friends and Family:   . Frequency of Social Gatherings with Friends and Family:   . Attends Religious Services:   . Active Member of Clubs or Organizations:   . Attends Banker Meetings:   Marland Kitchen Marital Status:    Family History  Problem Relation Age of Onset  . Stroke Father   . Hypertension Father   . Hyperlipidemia Father   . Colon polyps Father   . Thyroid disease Sister   . Ovarian cysts Sister   . Hyperlipidemia Sister   . Lung cancer Maternal Grandfather   . Other Paternal Grandmother        hip replacemnt  . Alzheimer's disease Paternal Grandfather   .  Colon cancer Neg Hx   . Esophageal cancer Neg Hx   . Rectal cancer Neg Hx   . Stomach cancer Neg Hx    Allergies  Allergen Reactions  . Gramineae Pollens   . Pollen Extract    Prior to Admission medications   Medication Sig Start Date End Date Taking? Authorizing Provider  albuterol (PROVENTIL HFA;VENTOLIN HFA) 108 (90 Base) MCG/ACT inhaler TAKE 2 PUFFS BY MOUTH EVERY 6 HOURS AS NEEDED FOR WHEEZE OR SHORTNESS OF BREATH 05/02/18  Yes Stallings, Zoe A, MD  esomeprazole (NEXIUM) 40 MG capsule TAKE 1 CAPSULE BY MOUTH EVERY DAY 01/07/20  Yes Doristine Bosworth, MD  levonorgestrel (MIRENA) 20 MCG/24HR IUD 1 each by Intrauterine route once.   Yes [provider]  meclizine (ANTIVERT) 12.5 MG tablet Take 1 or 2 pills every 6 hours if needed for dizziness 01/24/20  Yes Peyton Najjar, MD  mometasone (NASONEX) 50 MCG/ACT nasal spray Place 2 sprays into the nose daily. 03/07/18  Yes Doristine Bosworth, MD   Multiple Vitamin (MULTIVITAMIN) capsule Take 1 capsule by mouth daily.   Yes [provider]  sucralfate (CARAFATE) 1 g tablet Take 1 tablet (1 g total) by mouth 4 (four) times daily -  with meals and at bedtime. 10/16/19  Yes Stallings, Zoe A, MD  ondansetron (ZOFRAN ODT) 4 MG disintegrating tablet Dissolve 1 under tongue every 6 or 8 hours as needed for nausea or vomiting. 08/27/18   Peyton Najjar, MD     Positive ROS: Otherwise negative  All other systems have been reviewed and were otherwise negative with the exception of those mentioned in the HPI and as above.  Physical Exam: Constitutional: Alert, well-appearing, no acute distress Ears: External ears without lesions or tenderness. Ear canals are clear bilaterally with intact, clear TMs.  Nasal: External nose without lesions. Clear nasal passages Oral: Lips and gums without lesions. Tongue and palate mucosa without lesions. Posterior oropharynx clear. Neck: No palpable adenopathy or masses Respiratory: Breathing comfortably  Skin: No facial/neck lesions or rash noted.  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 She will follow-up in a month for recheck and audiologic testing. I also prescribed him Valium 2 mg every 8 hours as needed dizziness to compare to the meclizine.   Narda Bonds, MD

## 2020-05-06 ENCOUNTER — Other Ambulatory Visit: Payer: Self-pay

## 2020-05-06 ENCOUNTER — Encounter (INDEPENDENT_AMBULATORY_CARE_PROVIDER_SITE_OTHER): Payer: Self-pay | Admitting: Otolaryngology

## 2020-05-06 ENCOUNTER — Ambulatory Visit (INDEPENDENT_AMBULATORY_CARE_PROVIDER_SITE_OTHER): Payer: BC Managed Care – PPO | Admitting: Otolaryngology

## 2020-05-06 VITALS — Temp 97.3°F

## 2020-05-06 DIAGNOSIS — H8102 Meniere's disease, left ear: Secondary | ICD-10-CM

## 2020-05-06 NOTE — Progress Notes (Signed)
HPI: Amy Hull is a 39 y.o. female who returns today for evaluation of left ear hearing loss and vertigo consistent with Mnire's disease.  She was started on a diuretic Dyazide 1 month ago and has done better since taking the diuretic.  I reviewed Mnire's disease with her and she is also on a low-salt diet. 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  Past Medical History:  Diagnosis Date   Acne    Accutane 2013   Allergy    seasonal    Anxiety    Asthma    exercise induced   Genital herpes simplex type 1 infection 02/2012   questionable dermatologist diagnosed other skin issue   Herpes labialis    HLD (hyperlipidemia)    Lactose intolerance    Past Surgical History:  Procedure Laterality Date   CYST REMOVAL TRUNK  10/2012   mid breast area   Dental implants  2009   Mirena   10/07 and 10/12/10   Social History   Socioeconomic History   Marital status: Single    Spouse name: Not on file   Number of children: 0   Years of education: Not on file   Highest education level: Not on file  Occupational History   Occupation: Tree surgeon  Tobacco Use   Smoking status: Never Smoker   Smokeless tobacco: Never Used  Building services engineer Use: Never used  Substance and Sexual Activity   Alcohol use: Yes    Alcohol/week: 1.0 standard drink    Types: 1 Glasses of wine per week    Comment: 1-2 drinks a week, mostly weekends   Drug use: No   Sexual activity: Yes    Partners: Male    Birth control/protection: I.U.D.    Comment: 1 sexual partner in last 12 months  Other Topics Concern   Not on file  Social History Narrative   Single.   Social Determinants of Health   Financial Resource Strain:    Difficulty of Paying Living Expenses: Not on file  Food Insecurity:    Worried About  Programme researcher, broadcasting/film/video in the Last Year: Not on file   [employer] of Food in the Last Year: Not on file  Transportation Needs:    Lack of Transportation (Medical): Not on file   Lack of Transportation (Non-Medical): Not on file  Physical Activity:    Days of Exercise per Week: Not on file   Minutes of Exercise per Session: Not on file  Stress:    Feeling of Stress : Not on file  Social Connections:    Frequency of Communication with Friends and Family: Not on file   Frequency of Social Gatherings with Friends and Family: Not on file   Attends Religious Services: Not on file   Active Member of Clubs or Organizations: Not on file   Attends Banker Meetings: Not on file   Marital Status: Not on file   Family History  Problem Relation Age of Onset   Stroke Father    Hypertension Father    Hyperlipidemia Father    Colon polyps Father    Thyroid disease Sister    Ovarian cysts Sister    Hyperlipidemia Sister    Lung cancer Maternal Grandfather    Other Paternal Grandmother  hip replacemnt   Alzheimer's disease Paternal Grandfather    Colon cancer Neg Hx    Esophageal cancer Neg Hx    Rectal cancer Neg Hx    Stomach cancer Neg Hx    Allergies  Allergen Reactions   Gramineae Pollens    Pollen Extract    Prior to Admission medications   Medication Sig Start Date End Date Taking? Authorizing Provider  albuterol (PROVENTIL HFA;VENTOLIN HFA) 108 (90 Base) MCG/ACT inhaler TAKE 2 PUFFS BY MOUTH EVERY 6 HOURS AS NEEDED FOR WHEEZE OR SHORTNESS OF BREATH 05/02/18  Yes Stallings, Zoe A, MD  esomeprazole (NEXIUM) 40 MG capsule TAKE 1 CAPSULE BY MOUTH EVERY DAY 01/07/20  Yes Doristine Bosworth, MD  levonorgestrel (MIRENA) 20 MCG/24HR IUD 1 each by Intrauterine route once.   Yes [provider]  meclizine (ANTIVERT) 12.5 MG tablet Take 1 or 2 pills every 6 hours if needed for dizziness 01/24/20  Yes Peyton Najjar, MD  mometasone (NASONEX) 50  MCG/ACT nasal spray Place 2 sprays into the nose daily. 03/07/18  Yes Doristine Bosworth, MD  Multiple Vitamin (MULTIVITAMIN) capsule Take 1 capsule by mouth daily.   Yes [provider]  sucralfate (CARAFATE) 1 g tablet Take 1 tablet (1 g total) by mouth 4 (four) times daily -  with meals and at bedtime. 10/16/19  Yes Stallings, Zoe A, MD  ondansetron (ZOFRAN ODT) 4 MG disintegrating tablet Dissolve 1 under tongue every 6 or 8 hours as needed for nausea or vomiting. 08/27/18   Peyton Najjar, MD     Positive ROS: Otherwise negative  All other systems have been reviewed and were otherwise negative with the exception of those mentioned in the HPI and as above.  Physical Exam: Constitutional: Alert, well-appearing, no acute distress Ears: External ears without lesions or tenderness. Ear canals are clear bilaterally with intact, clear TMs bilaterally. Nasal: External nose without lesions Clear nasal passages Oral: Lips and gums without lesions. Tongue and palate mucosa without lesions. Posterior oropharynx clear. Neck: No palpable adenopathy or masses. Respiratory: Breathing comfortably  Skin: No facial/neck lesions or rash noted.   Audiogram today demonstrated normal hearing in the right ear and slightly depressed hearing in the left ear down to 25 DB in the lower frequency and approximately 20 DB in the mid and upper frequency.  SRT's were 5 dB on the right and 20 dB on the left.  Procedures  Assessment: Left ear Mnire's disease  Plan: Discussed again with her concerning low-salt diet and diuretic which should help control the Mnire's disease. Refilled medications Valium 2 mg and meclizine 25 mg to take if she is having an attack of vertigo. We gave her copies of her hearing test as she is getting ready to move with change in her job. She will contact us if she has any vertigo episodes.   Narda Bonds, MD

## 2020-05-07 ENCOUNTER — Encounter (INDEPENDENT_AMBULATORY_CARE_PROVIDER_SITE_OTHER): Payer: Self-pay

## 2020-05-21 ENCOUNTER — Ambulatory Visit: Payer: BC Managed Care – PPO | Admitting: Family Medicine

## 2020-05-22 IMAGING — US US ABDOMEN LIMITED
1 series · 14 of 25 positions shown · non-contrast
Comparison: 08/28/2018

CLINICAL DATA: Right upper quadrant pain

EXAM:
ULTRASOUND ABDOMEN LIMITED RIGHT UPPER QUADRANT

[Series 1: us abdomen limited · 0.19mm/px · 14 of 44 slices shown]
[im 1/44]
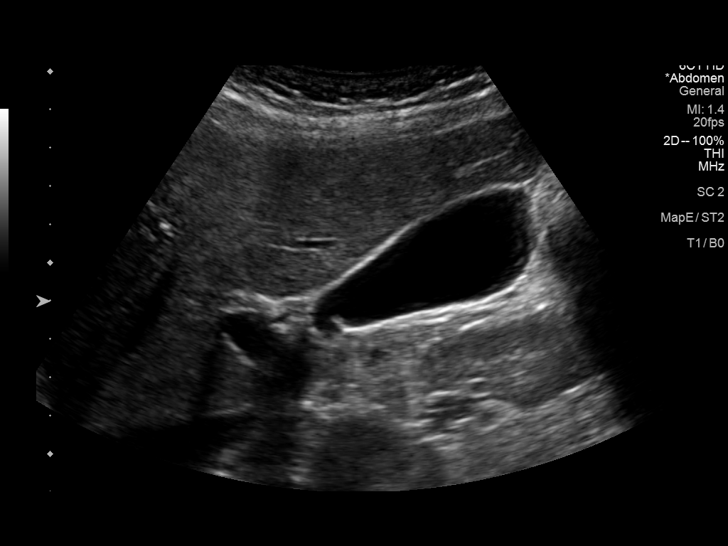
[im 4/44]
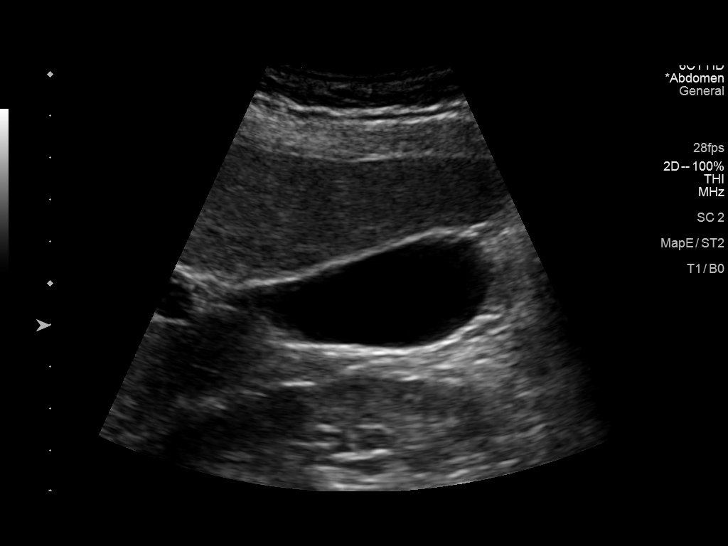
[im 8/44]
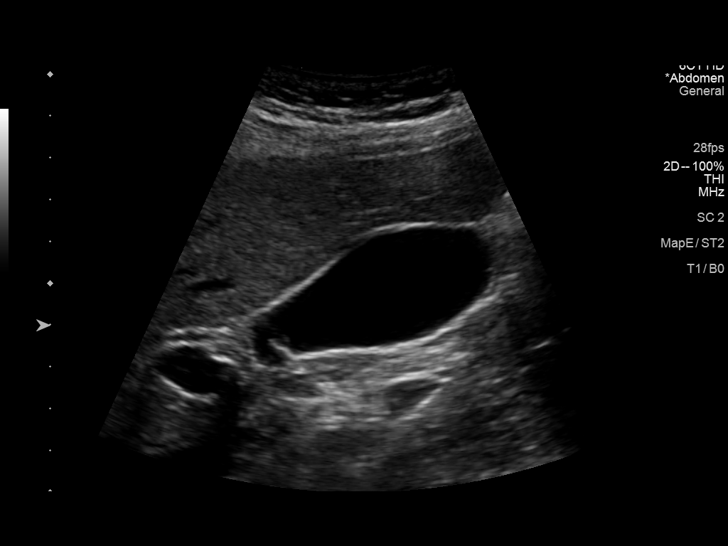
[im 11/44]
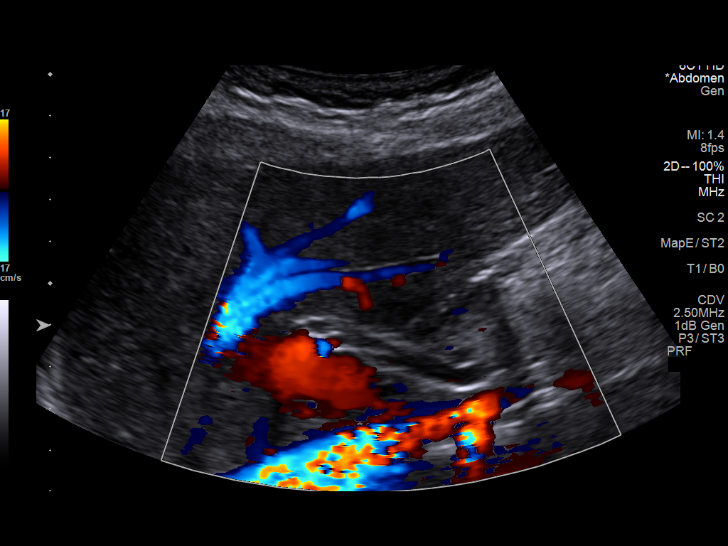
[im 15/44]
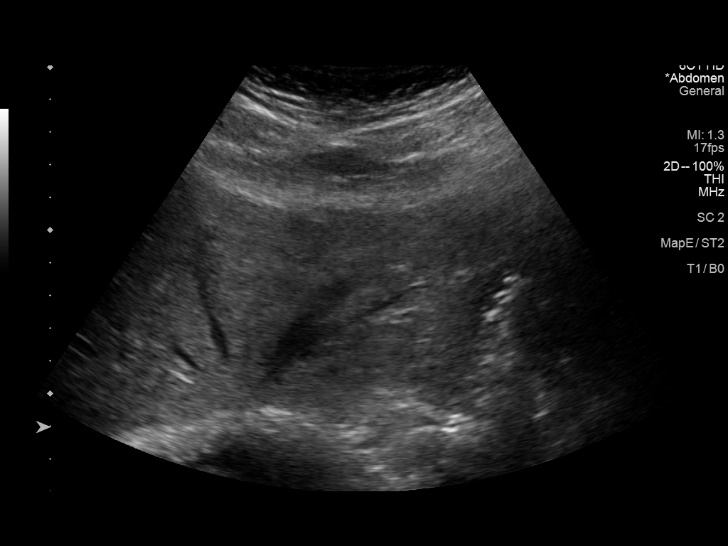
[im 17/44]
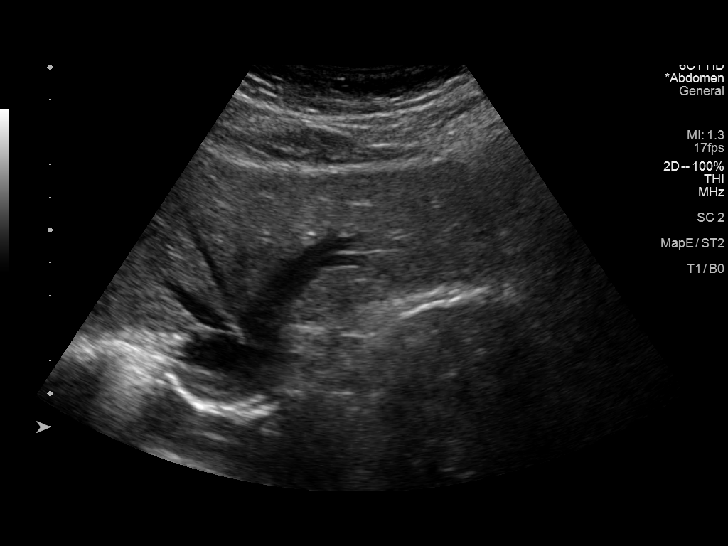
[im 20/44]
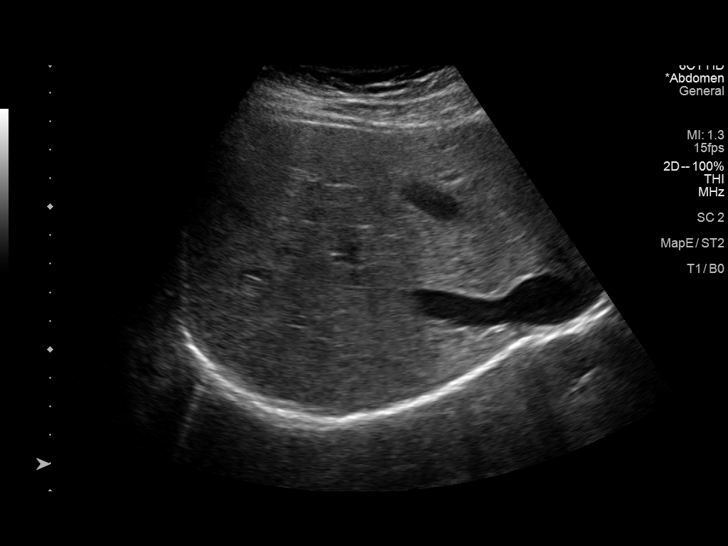
[im 24/44]
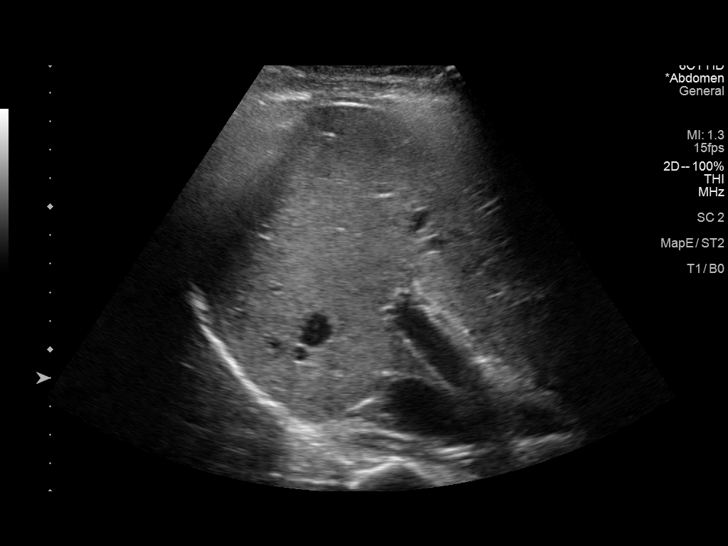
[im 27/44]
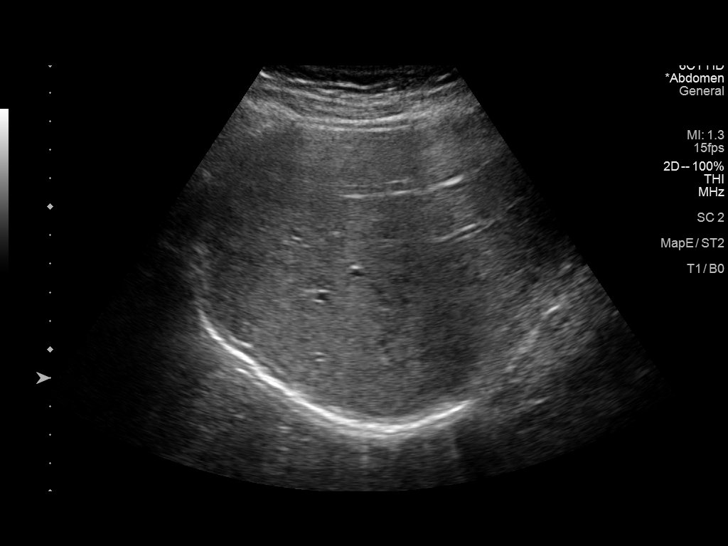
[im 29/44]
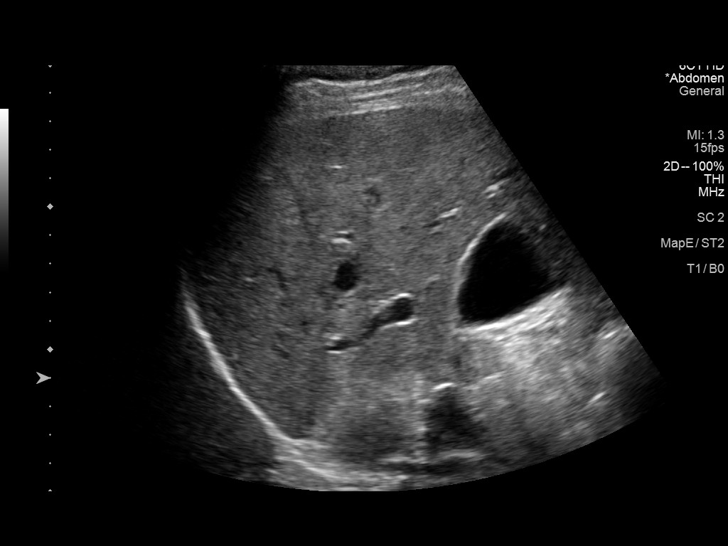
[im 33/44]
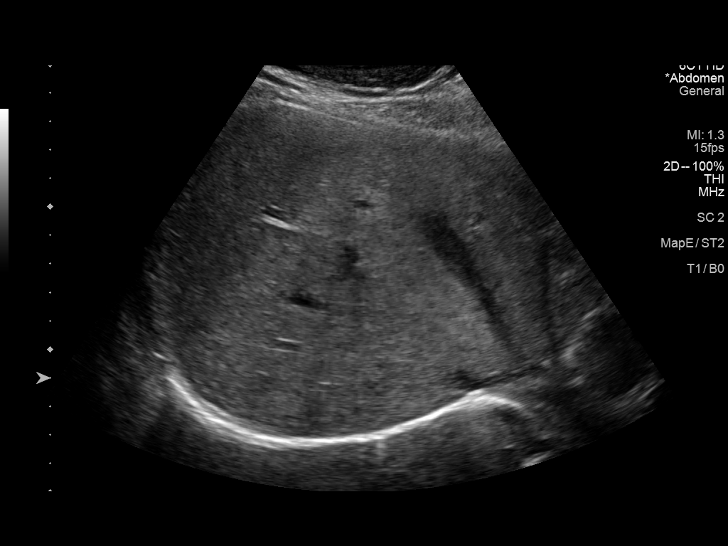
[im 36/44]
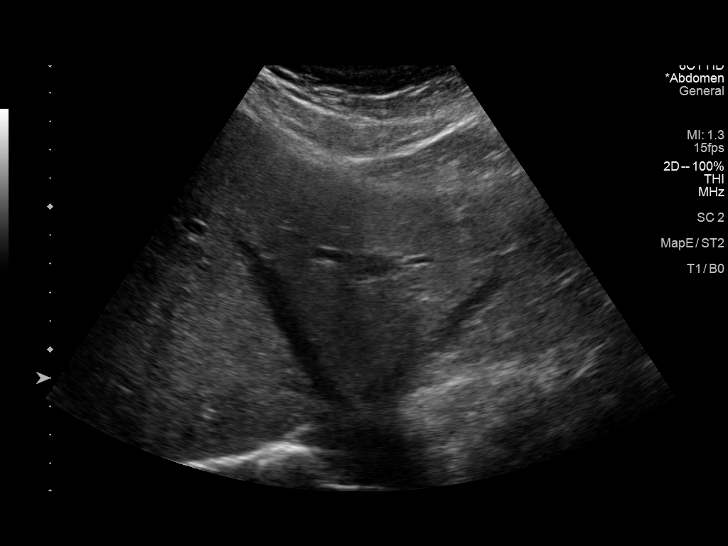
[im 40/44]
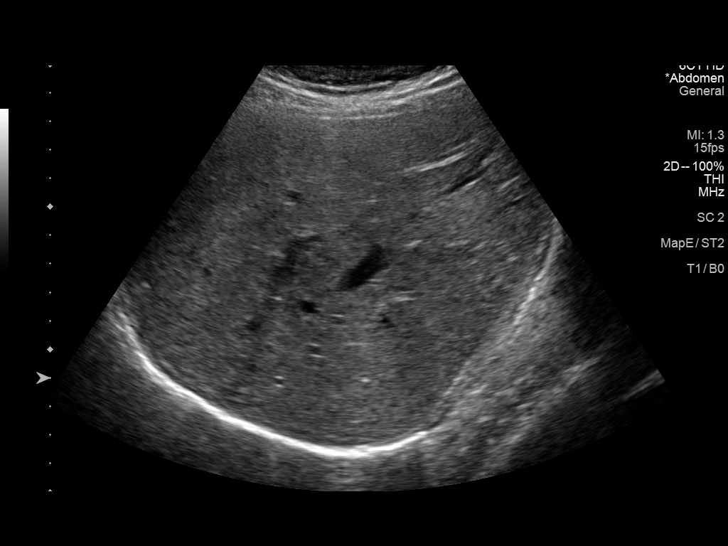
[im 44/44]
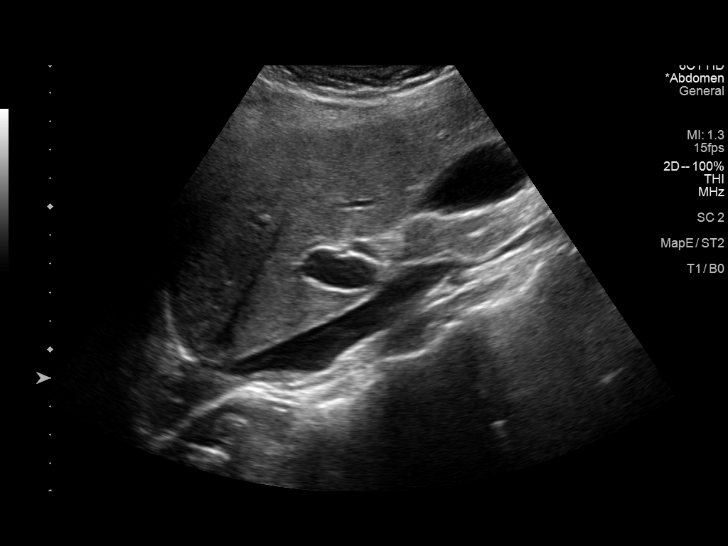

[14 of 25 positions shown; findings below may reference images not displayed]

FINDINGS: Gallbladder:

No gallstones or wall thickening visualized. No sonographic Murphy
sign noted by sonographer.

Common bile duct:

Diameter: 2.6 mm

Liver:

No focal lesion identified. Within normal limits in parenchymal
echogenicity. Portal vein is patent on color Doppler imaging with
normal direction of blood flow towards the liver.

Other: None.
IMPRESSION: No acute abnormality noted.
